# Patient Record
Sex: Male | Born: 1955 | Race: White | Hispanic: No | Marital: Married | Smoking: Never smoker
Health system: Southern US, Community
[De-identification: ages and names within clinical notes are randomized; demographics above are authoritative.]

## PROBLEM LIST (undated history)

## (undated) DIAGNOSIS — Z9989 Dependence on other enabling machines and devices: Secondary | ICD-10-CM

## (undated) DIAGNOSIS — G4733 Obstructive sleep apnea (adult) (pediatric): Secondary | ICD-10-CM

## (undated) DIAGNOSIS — G8929 Other chronic pain: Secondary | ICD-10-CM

## (undated) DIAGNOSIS — E119 Type 2 diabetes mellitus without complications: Secondary | ICD-10-CM

## (undated) DIAGNOSIS — M1990 Unspecified osteoarthritis, unspecified site: Secondary | ICD-10-CM

## (undated) DIAGNOSIS — M549 Dorsalgia, unspecified: Secondary | ICD-10-CM

## (undated) DIAGNOSIS — T8859XA Other complications of anesthesia, initial encounter: Secondary | ICD-10-CM

## (undated) DIAGNOSIS — R112 Nausea with vomiting, unspecified: Secondary | ICD-10-CM

## (undated) DIAGNOSIS — F419 Anxiety disorder, unspecified: Secondary | ICD-10-CM

## (undated) DIAGNOSIS — Z9889 Other specified postprocedural states: Secondary | ICD-10-CM

## (undated) DIAGNOSIS — E039 Hypothyroidism, unspecified: Secondary | ICD-10-CM

## (undated) DIAGNOSIS — I10 Essential (primary) hypertension: Secondary | ICD-10-CM

## (undated) DIAGNOSIS — T4145XA Adverse effect of unspecified anesthetic, initial encounter: Secondary | ICD-10-CM

## (undated) DIAGNOSIS — Z9289 Personal history of other medical treatment: Secondary | ICD-10-CM

## (undated) HISTORY — PX: BACK SURGERY: SHX140

## (undated) HISTORY — PX: HERNIA REPAIR: SHX51

## (undated) HISTORY — PX: KNEE ARTHROSCOPY: SUR90

## (undated) HISTORY — PX: POSTERIOR LUMBAR FUSION: SHX6036

## (undated) HISTORY — PX: CHOLECYSTECTOMY: SHX55

## (undated) HISTORY — PX: KNEE ARTHROSCOPY: SHX127

---

## 1979-01-13 HISTORY — PX: VARICOSE VEIN SURGERY: SHX832

## 1979-05-15 HISTORY — PX: APPENDECTOMY: SHX54

## 2000-03-21 ENCOUNTER — Encounter: Payer: Self-pay | Admitting: Unknown Physician Specialty

## 2000-03-21 ENCOUNTER — Ambulatory Visit (HOSPITAL_COMMUNITY): Admission: RE | Admit: 2000-03-21 | Discharge: 2000-03-21 | Payer: Self-pay | Admitting: Unknown Physician Specialty

## 2000-06-29 ENCOUNTER — Encounter: Payer: Self-pay | Admitting: Neurosurgery

## 2000-06-29 ENCOUNTER — Ambulatory Visit (HOSPITAL_COMMUNITY): Admission: RE | Admit: 2000-06-29 | Discharge: 2000-06-29 | Payer: Self-pay | Admitting: Neurosurgery

## 2003-05-15 HISTORY — PX: EXCISIONAL HEMORRHOIDECTOMY: SHX1541

## 2006-05-14 HISTORY — PX: ANTERIOR CERVICAL DECOMP/DISCECTOMY FUSION: SHX1161

## 2007-02-25 ENCOUNTER — Ambulatory Visit (HOSPITAL_COMMUNITY): Admission: RE | Admit: 2007-02-25 | Discharge: 2007-02-26 | Payer: Self-pay | Admitting: Neurosurgery

## 2007-05-15 HISTORY — PX: ANTERIOR CERVICAL DECOMP/DISCECTOMY FUSION: SHX1161

## 2008-05-14 HISTORY — PX: CHOLECYSTECTOMY: SHX55

## 2008-05-14 HISTORY — PX: BICEPS TENDON REPAIR: SHX566

## 2009-05-14 HISTORY — PX: COLONOSCOPY: SHX174

## 2010-05-14 HISTORY — PX: HERNIA REPAIR: SHX51

## 2010-09-26 NOTE — Op Note (Signed)
NAME:  Joshua Nielsen, Joshua Nielsen NO.:  [id number]   MEDICAL RECORD NO.:  [id number]          PATIENT TYPE:  OIB   LOCATION:  3172                         FACILITY:  [facility]   PHYSICIAN:  Payton Doughty, M.D.      DATE OF BIRTH:  21-Feb-1956   DATE OF PROCEDURE:  02/25/2007  DATE OF DISCHARGE:                               OPERATIVE REPORT   PREOPERATIVE DIAGNOSIS:  Spondylosis C6-7.   POSTOPERATIVE DIAGNOSIS:  Spondylosis C6-7.   OPERATIVE PROCEDURE:  C6-7 anterior cervical decompression fusion with  Reflex hybrid plate.   ANESTHESIA:  General endotracheal.   PREP:  Sterile Betadine prep and scrub with alcohol wipe.   COMPLICATIONS:  None.   ASSISTANT:  Clydene Fake, M.D.   BODY OF TEXT:  A 55 year old gentleman with cervical spondylosis and a  right C7 radiculopathy, taken to operating room smoothly anesthetized  intubated, placed supine on the operating table in halter head traction.  Following shave, prep, draped in the usual sterile fashion, skin was  incised from midline to medial border sternocleidomastoid muscle on the  left side.  The platysma was identified, elevated, divided and  undermined.  Sternocleidomastoid was identified and medial dissection  revealed the carotid artery retracted laterally to the left, trachea and  esophagus retracted laterally to right exposing the bones the anterior  cervical spine.  Marker was placed.  Intraoperative x-ray obtained,  confirmed correctness of level.  Having confirmed correctness of level,  diskectomy was carried out under gross observation.  The operating  microscope was then brought in.  We used microdissection technique to  remove the remaining disks, dissect the neural foramina bilaterally and  divide the posterior longitudinal ligament.  There was significant  spondylosis and spurring found much worse on the right than on the left.  Following complete decompression an 8 mm bone graft fashioned from  patellar  allograft and tapped into place.  The 16 mm Reflex hybrid plate  was then placed using 12 mm screws, two in C6 and two in C7.  Intraoperative x-ray could show only the top of the construct.  Wound  was irrigated.  Hemostasis assured and successive layers of 3-0 Vicryl,  4-0 Vicryl used to close.  Benzoin, Steri-Strips were placed, made with  occlusive Telfa and OpSite.  The patient placed in Aspen collar and  returned recovery room in good condition.           ______________________________  Payton Doughty, M.D.     MWR/MEDQ  D:  02/25/2007  T:  02/26/2007  Job:  [id number]

## 2010-09-26 NOTE — H&P (Signed)
NAME:  Joshua, Nielsen NO.:  [id number]   MEDICAL RECORD NO.:  [id number]          PATIENT TYPE:  OIB   LOCATION:  5157                         FACILITY:  [facility]   PHYSICIAN:  Payton Doughty, M.D.      DATE OF BIRTH:  1956/03/16   DATE OF ADMISSION:  02/25/2007  DATE OF DISCHARGE:                              HISTORY & PHYSICAL   ADMITTING DIAGNOSIS:  Right C7 radiculopathy secondary to disk and spur.   HISTORY OF PRESENT ILLNESS:  Joshua Nielsen is a self-referred 55 year old  right-handed white gentleman, Dr. Roxan Hockey operated on 15 years for disk  in his lumbar spine, having pain in his neck and his arm around his  right shoulder.  He has numbness in the dorsum of the right hand,  particularly at the middle finger.  The left hand does not appear  affected.  He can bring his arm by extension and rightward rotation of  his head.   MEDICAL HISTORY:  Benign.  He has hypertension for which he is on Diovan  and Lotrel.  He takes Synthroid and Xanax.   ALLERGIES:  He is allergic to DEMEROL AND LEVAQUIN.   SURGICAL HISTORY:  He has had an appendectomy, vein stripping,  arthroplasty, diskectomy, hemorrhages and a left knee.   SOCIAL HISTORY:  He does not smoke and does not drink.  He is a  Public affairs consultant.   FAMILY HISTORY:  His mom is 54.  She has had a TIAs, mitral valve  prolapse, hypertension and anemia.  His daddy is deceased with coronary  artery disease in 84.   REVIEW OF SYSTEMS:  Remarkable for glasses, hypertension, back pain and  neck pain.   PHYSICAL EXAMINATION:  HEENT:  Normal.  He has reasonable range of  motion of his neck, but he does set his arm off by turning his head to  the right.  CHEST:  Clear.  CARDIAC:  Regular rate and rhythm.  ABDOMEN:  Large but nontender with hepatosplenomegaly.  EXTREMITIES:  Without clubbing or cyanosis.  Peripheral pulses are good.  GU:  Deferred.  NEUROLOGICAL:  He is awake, alert and oriented.  His  cranial nerves are  intact.  Motor exam shows 5/5 strength in the upper extremities.  Sensory deficit described in the right C7 distribution.  Reflexes are 1  at the left biceps, 1 at the right biceps, 1 at the left triceps, absent  at the right triceps, 1 at the brachial radialis bilaterally.  Hoffman's  is negative.   STUDIES:  MR demonstrates disk and spur at C6-7 with compression in the  right C7 root.   PLAN:  Anterior decompression and fusion at C6-7.  The risks and  benefits have been discussed with them and they wish to proceed.           ______________________________  Payton Doughty, M.D.     MWR/MEDQ  D:  02/25/2007  T:  02/25/2007  Job:  [id number]

## 2011-02-22 LAB — COMPREHENSIVE METABOLIC PANEL
ALT: 51
AST: 48 — ABNORMAL HIGH
Alkaline Phosphatase: 64
CO2: 31
Calcium: 9.1
Chloride: 101
GFR calc Af Amer: >60
GFR calc non Af Amer: >60
Glucose, Bld: 95
Potassium: 3.8
Sodium: 137
Total Bilirubin: 2.1 — ABNORMAL HIGH

## 2011-02-22 LAB — URINALYSIS, ROUTINE W REFLEX MICROSCOPIC
Bilirubin Urine: NEGATIVE
Glucose, UA: NEGATIVE
Hgb urine dipstick: NEGATIVE
Protein, ur: NEGATIVE

## 2011-02-22 LAB — DIFFERENTIAL
Basophils Relative: 1
Eosinophils Absolute: 0.1
Eosinophils Relative: 2
Lymphs Abs: 1.9
Neutrophils Relative %: 61

## 2011-02-22 LAB — CBC
Hemoglobin: 15.5
RBC: 5.03
WBC: 7.2

## 2011-02-22 LAB — TYPE AND SCREEN
ABO/RH(D): A POS
Antibody Screen: NEGATIVE

## 2011-02-22 LAB — PROTIME-INR: Prothrombin Time: 14

## 2012-03-07 ENCOUNTER — Other Ambulatory Visit: Payer: Self-pay | Admitting: Neurosurgery

## 2012-03-07 DIAGNOSIS — M47812 Spondylosis without myelopathy or radiculopathy, cervical region: Secondary | ICD-10-CM

## 2012-03-14 ENCOUNTER — Ambulatory Visit
Admission: RE | Admit: 2012-03-14 | Discharge: 2012-03-14 | Disposition: A | Discharge status: Home / Self Care | Payer: Managed Care, Other (non HMO) | Source: Ambulatory Visit | Attending: Neurosurgery | Admitting: Neurosurgery

## 2012-03-14 VITALS — BP 134/82 | HR 45

## 2012-03-14 DIAGNOSIS — M47812 Spondylosis without myelopathy or radiculopathy, cervical region: Secondary | ICD-10-CM

## 2012-03-14 MED ORDER — IOHEXOL 300 MG/ML  SOLN
1.0000 mL | Freq: Once | INTRAMUSCULAR | Status: AC | PRN
  Administered 2012-03-14: 1 mL via EPIDURAL

## 2012-03-14 MED ORDER — TRIAMCINOLONE ACETONIDE 40 MG/ML IJ SUSP (RADIOLOGY)
60.0000 mg | Freq: Once | INTRAMUSCULAR | Status: AC
  Administered 2012-03-14: 60 mg via EPIDURAL

## 2012-03-14 NOTE — Discharge Instructions (Signed)

## 2012-04-18 ENCOUNTER — Other Ambulatory Visit: Payer: Self-pay | Admitting: Neurosurgery

## 2012-04-18 DIAGNOSIS — M4722 Other spondylosis with radiculopathy, cervical region: Secondary | ICD-10-CM

## 2012-04-23 ENCOUNTER — Ambulatory Visit
Admission: RE | Admit: 2012-04-23 | Discharge: 2012-04-23 | Disposition: A | Discharge status: Home / Self Care | Payer: Managed Care, Other (non HMO) | Source: Ambulatory Visit | Attending: Neurosurgery | Admitting: Neurosurgery

## 2012-04-23 VITALS — BP 153/88 | HR 56 | Ht 70.0 in | Wt 240.0 lb

## 2012-04-23 DIAGNOSIS — M4722 Other spondylosis with radiculopathy, cervical region: Secondary | ICD-10-CM

## 2012-04-23 MED ORDER — IOHEXOL 300 MG/ML  SOLN
1.0000 mL | Freq: Once | INTRAMUSCULAR | Status: AC | PRN
  Administered 2012-04-23: 1 mL via EPIDURAL

## 2012-04-23 MED ORDER — TRIAMCINOLONE ACETONIDE 40 MG/ML IJ SUSP (RADIOLOGY)
60.0000 mg | Freq: Once | INTRAMUSCULAR | Status: AC
  Administered 2012-04-23: 60 mg via EPIDURAL

## 2012-05-14 DIAGNOSIS — E119 Type 2 diabetes mellitus without complications: Secondary | ICD-10-CM

## 2012-05-14 HISTORY — DX: Type 2 diabetes mellitus without complications: E11.9

## 2012-08-13 ENCOUNTER — Other Ambulatory Visit: Payer: Self-pay | Admitting: Neurosurgery

## 2012-08-13 DIAGNOSIS — M47816 Spondylosis without myelopathy or radiculopathy, lumbar region: Secondary | ICD-10-CM

## 2012-08-19 ENCOUNTER — Ambulatory Visit
Admission: RE | Admit: 2012-08-19 | Discharge: 2012-08-19 | Disposition: A | Discharge status: Home / Self Care | Payer: Managed Care, Other (non HMO) | Source: Ambulatory Visit | Attending: Neurosurgery | Admitting: Neurosurgery

## 2012-08-19 DIAGNOSIS — M47816 Spondylosis without myelopathy or radiculopathy, lumbar region: Secondary | ICD-10-CM

## 2012-08-26 ENCOUNTER — Other Ambulatory Visit: Payer: Self-pay | Admitting: Neurosurgery

## 2012-09-04 NOTE — Pre-Procedure Instructions (Signed)
TISHAWN FRIEDHOFF  09/04/2012   Your procedure is scheduled on:  Friday Sep 19, 2012.  Report to [facility] Elevators 3rd floor at 5:30 AM.  Call this number if you have problems the morning of surgery: [phone]   Remember:   Do not eat food or drink liquids after midnight.   Take these medicines the morning of surgery with A SIP OF WATER: Lotrel, Synthroid, and Xanax if needed Do not take any diabetic medications the day of surgery  Discontinue aspirin, Coumadin, Plavix, Effient, and herbal medications 7 days prior to surgery   Do not wear jewelry  Do not wear lotions or colognes.  . Men may shave face and neck.  Do not bring valuables to the hospital.  Contacts, dentures or bridgework may not be worn into surgery.  Leave suitcase in the car. After surgery it may be brought to your room.  For patients admitted to the hospital, checkout time is 11:00 AM the day of discharge.   Patients discharged the day of surgery will not be allowed to drive home.  Name and phone number of your driver: Family/Friend  Special Instructions: Shower using CHG 2 nights before surgery and the night before surgery.  If you shower the day of surgery use CHG.  Use special wash - you have one bottle of CHG for all showers.  You should use approximately 1/3 of the bottle for each shower.   Please read over the following fact sheets that you were given: Pain Booklet, Coughing and Deep Breathing, Blood Transfusion Information, MRSA Information and Surgical Site Infection Prevention

## 2012-09-05 ENCOUNTER — Encounter (HOSPITAL_COMMUNITY): Payer: Self-pay

## 2012-09-05 ENCOUNTER — Encounter (HOSPITAL_COMMUNITY)
Admission: RE | Admit: 2012-09-05 | Discharge: 2012-09-05 | Disposition: A | Discharge status: Home / Self Care | Payer: Managed Care, Other (non HMO) | Source: Ambulatory Visit | Attending: Neurosurgery | Admitting: Neurosurgery

## 2012-09-05 HISTORY — DX: Other specified postprocedural states: Z98.890

## 2012-09-05 HISTORY — DX: Nausea with vomiting, unspecified: R11.2

## 2012-09-05 HISTORY — DX: Hypothyroidism, unspecified: E03.9

## 2012-09-05 HISTORY — DX: Essential (primary) hypertension: I10

## 2012-09-05 HISTORY — DX: Unspecified osteoarthritis, unspecified site: M19.90

## 2012-09-05 LAB — CBC
Hemoglobin: 15 g/dL (ref 13.0–17.0)
MCHC: 33.9 g/dL (ref 30.0–36.0)
RDW: 13.3 % (ref 11.5–15.5)

## 2012-09-05 LAB — BASIC METABOLIC PANEL
BUN: 14 mg/dL (ref 6–23)
GFR calc Af Amer: >90 mL/min (ref >90)
GFR calc non Af Amer: >90 mL/min (ref >90)
Potassium: 3.7 mEq/L (ref 3.5–5.1)
Sodium: 142 mEq/L (ref 135–145)

## 2012-09-05 LAB — SURGICAL PCR SCREEN
MRSA, PCR: NEGATIVE
Staphylococcus aureus: NEGATIVE

## 2012-09-05 NOTE — Progress Notes (Signed)
Patient informed Nurse that he had a stress test in 2007 at [facility] in [place], [place]. Patient denied having a cardiac cath. Patient wears CPAP nightly and was instructed to bring mask DOS. Patient verbalized understanding. Type and screen will be done DOS per patients request. Orders entered STAT into EPIC for DOS.

## 2012-09-08 ENCOUNTER — Encounter (HOSPITAL_COMMUNITY): Payer: Self-pay | Admitting: Pharmacist

## 2012-09-18 MED ORDER — VANCOMYCIN HCL IN DEXTROSE 1-5 GM/200ML-% IV SOLN
1000.0000 mg | INTRAVENOUS | Status: AC
  Administered 2012-09-19: 1000 mg via INTRAVENOUS

## 2012-09-18 NOTE — H&P (Signed)
[facility] [address] [place], [place]  [address] [phone]  Fax [phone]  OUTPATIENT OFFICE NOTE  August 25, 2012  Patient Name:  Joshua Nielsen   #[id number] Date of Birth:  Jun 07, 1955    HOPI:      I had the pleasure of seeing [name] back today after his MRI was obtained on an urgent basis.  He has severe spinal stenosis and lateral recess stenosis, right greater than left, at the L4-5 level, the site of previous decompressive laminectomy for spinal stenosis.  He is complaining of significant right leg pain.  He is barely able to bear weight.  He leans toward the left.  He says he is miserable.  IMPRESSION/PLAN:  I do not believe that nonsurgical options will likely give him much relief, and I do think that he needs to undergo redo decompression as well as a fusion at the L4-5 level.  This can likely be done as a TLIF approach, but I think it will necessary to disarticulate his facet joint in order to gain access to the severe spondylotic and broad foraminal stenosis and compressive problem.  Based on this, I recommended redo decompression and fusion at the L4-5 level.  He wishes to proceed, and this will be scheduled on an expedited basis.  We went over details of the surgery, attendant risks and benefits, and went over models of specific surgery we recommended as well as fitting him for a lumbosacral orthosis.  He wishes to proceed.  He is now scheduled for surgery on 09/19/2012.     Danae Orleans. 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            HISTORY OF PRESENT ILLNESS:                       He currently describes his low-back as 1-2 out of 10 but right  buttock pain as 6-8 out of 10 in severity. He also has neck pain, which he grades at 4-5 out of 10, and right arm pain, which he grades at 0-1 out of 10. He notes numbness in his right thigh, which he describes that it feels as he has hot water running down it.  He also notes numbness into his right foot and right leg.  He feels that he is weak in his right leg.  He says this has been going on since December 2013.  He is scheduled for an MRI of his lumbar spine tomorrow.  He saw Dr. Channing Mutters in 01/2012 for neck pain and also notes a history of leg numbness and burning.  Dr. Channing Mutters referred him here due to distance as  he did not want to go to [facility] for further care.    He has been taking ibuprofen or Aleve on an as-needed basis.  He has previously had lumbar decompressive surgery by Dr. Roxan Hockey in 1992.  Additionally, he has had cervical epidural steroid injections in 03/2012 and 04/2012, which  he says helped for about 5 weeks.  Additional medical problems include hypertension, hypothyroidism, non-insulin-dependent diabetes, anxiety, and sleep apnea. He is 5 feet, 10 inches tall, 237 pounds.  He denies tobacco, alcohol, or drug use.   Joshua Nielsen gave a detailed description of his previous back problems.  In 1992, he had an injury while putting vinyl siding on a church, and then he had an MRI, which showed a herniated disk at L4-5 but with bed-rest and medicine it got better.  He then had another episode in 1993.  He had another MRI, again treated with bed-rest and medicine.  In 1994, he had another severe episode of right leg pain, and an MRI showed that he had re-ruptured the disk, and then he did surgery with a laminectomy for that.  He has been seeing Dr. Channing Mutters for his neck problems.    CURRENT MEDICATIONS:                       Include Diovan/HCT 320/25 mg daily, Lotrel 5/10 mg daily, Synthroid 0.025 mg daily, Xanax 0.5 mg as needed, Metformin HCl 500 mg 2 times daily, Phentermine 37.5 mg daily.  HE NOTES  ALLERGIES TO DEMEROL AND LEVAQUIN.   PAST SURGICAL HISTORY:                     He had an appendectomy in 1981 by Dr. Meryl Crutch, veins stripped in left leg, arthroscopy right knee in 1988 and 1989 by Dr. Cathrine Muster, back surgery at L4-5 by Dr. Roxan Hockey in 1994, hemorrhoidectomy by Dr. Logan Bores in 2005, arthroscopic left knee surgery in 2007 by Dr. Earma Reading, anterior decompression and fusion at C6-7 by Dr. Channing Mutters in October 2008, arthroscopic right knee surgery in 02/2008 by Dr. Earma Reading, repair of rupture biceps tendon right arm 01/2009 by Dr. Earma Reading, cholecystectomy in December 2010 by Dr. Irving Burton, incisional hernia repair in 05/2010 by Dr. Irving Burton.          PAST MEDICAL HISTORY:                       He had a stress test in 2007 before his knee surgery, colonoscopy in 2011, tetanus vaccination in 2008.  He started CPAP for sleep apnea in 2013.  He had an MRI in October 2013, which demonstrated a herniated cervical disk and resulted in epidural steroid injections in his neck.  He had a complete physical on 06/06/2012 and started metformin at that time.  Additional medical problems include injuries to his right finger, severely strained ankle, and measles and chicken pox as childhood illness.    REVIEW OF SYSTEMS:                              Detailed review of systems sheet was reviewed with the patient.  Under eyes, he wears glasses.  Under cardiovascular, he notes high blood pressure.  Under musculoskeletal, he notes leg weakness and back pain and neck pain.  Under endocrine, he notes diabetes and thyroid disease.  Otherwise unremarkable.   FAMILY HISTORY:                                     Mother is deceased at age 80.  Father died at age 64.  Medical problems noncontributory.  PHYSICAL EXAMINATION:                     [name] complains of pain radiating in his buttock, which he describes as "steady, like a toothache."  He notes right leg burning to his knee and outer foot.  His blood  pressure is 114/78, heart rate is 70 and regular, respiratory rate is 16.  He is obese.            HEENT -  Normal.            Respiratory -  clear to auscultation.            Cardiovascular -  regular rate and rhythm without murmur.            Abdomen -   obese, with active bowel sounds, no hepatosplenomegaly appreciated.            Musculoskeletal Examination -  has a healed midline lumbar incision, he has right sciatic notch discomfort to palpation.  He is able to bend within 8 inches of the floor with his upper extremities outstretched.  He is able to stand on his heels and toes, but walks with an antalgic gait favoring his right lower extremity.  He has positive straight leg raise at 30 degrees on the right.   NEUROLOGICAL EXAMINATION:        He is awake, alert, and conversant.  He speaks with clear and fluent speech.            Cranial Nerve Examination - normal.            Motor Examination - full in all upper and lower extremity motor groups.             Sensory Examination - he complains of numbness in his right second and third digits as well as decreased pin sensation right L5 and S1 distribution.            Cerebellar Examination - normal examination.   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  He is scheduled to have an MRI, and I will evaluate that with him and make further recommendations after that has been done.   [facility]       Danae Orleans. Venetia Maxon, MD

## 2012-09-19 ENCOUNTER — Encounter (HOSPITAL_COMMUNITY): Payer: Self-pay | Admitting: Anesthesiology

## 2012-09-19 ENCOUNTER — Ambulatory Visit (HOSPITAL_COMMUNITY): Payer: Managed Care, Other (non HMO) | Admitting: Anesthesiology

## 2012-09-19 ENCOUNTER — Encounter (HOSPITAL_COMMUNITY)
Admission: RE | Disposition: A | Discharge status: Home / Self Care | Payer: Self-pay | Source: Ambulatory Visit | Attending: Neurosurgery

## 2012-09-19 ENCOUNTER — Ambulatory Visit (HOSPITAL_COMMUNITY): Payer: Managed Care, Other (non HMO)

## 2012-09-19 ENCOUNTER — Inpatient Hospital Stay (HOSPITAL_COMMUNITY)
Admission: RE | Admit: 2012-09-19 | Discharge: 2012-09-22 | DRG: 460 | Disposition: A | Discharge status: Home / Self Care | Payer: Managed Care, Other (non HMO) | Source: Ambulatory Visit | Attending: Neurosurgery | Admitting: Neurosurgery

## 2012-09-19 DIAGNOSIS — F411 Generalized anxiety disorder: Secondary | ICD-10-CM | POA: Diagnosis present

## 2012-09-19 DIAGNOSIS — Z01812 Encounter for preprocedural laboratory examination: Secondary | ICD-10-CM

## 2012-09-19 DIAGNOSIS — Z01818 Encounter for other preprocedural examination: Secondary | ICD-10-CM

## 2012-09-19 DIAGNOSIS — E119 Type 2 diabetes mellitus without complications: Secondary | ICD-10-CM | POA: Diagnosis present

## 2012-09-19 DIAGNOSIS — E039 Hypothyroidism, unspecified: Secondary | ICD-10-CM | POA: Diagnosis present

## 2012-09-19 DIAGNOSIS — M47817 Spondylosis without myelopathy or radiculopathy, lumbosacral region: Secondary | ICD-10-CM | POA: Diagnosis present

## 2012-09-19 DIAGNOSIS — Z79899 Other long term (current) drug therapy: Secondary | ICD-10-CM

## 2012-09-19 DIAGNOSIS — Z01810 Encounter for preprocedural cardiovascular examination: Secondary | ICD-10-CM

## 2012-09-19 DIAGNOSIS — I10 Essential (primary) hypertension: Secondary | ICD-10-CM | POA: Diagnosis present

## 2012-09-19 DIAGNOSIS — E669 Obesity, unspecified: Secondary | ICD-10-CM | POA: Diagnosis present

## 2012-09-19 DIAGNOSIS — Q762 Congenital spondylolisthesis: Secondary | ICD-10-CM

## 2012-09-19 DIAGNOSIS — G4733 Obstructive sleep apnea (adult) (pediatric): Secondary | ICD-10-CM | POA: Diagnosis present

## 2012-09-19 DIAGNOSIS — M5126 Other intervertebral disc displacement, lumbar region: Principal | ICD-10-CM | POA: Diagnosis present

## 2012-09-19 HISTORY — DX: Type 2 diabetes mellitus without complications: E11.9

## 2012-09-19 HISTORY — DX: Obstructive sleep apnea (adult) (pediatric): G47.33

## 2012-09-19 HISTORY — DX: Other chronic pain: G89.29

## 2012-09-19 HISTORY — DX: Anxiety disorder, unspecified: F41.9

## 2012-09-19 HISTORY — DX: Dependence on other enabling machines and devices: Z99.89

## 2012-09-19 HISTORY — DX: Dorsalgia, unspecified: M54.9

## 2012-09-19 HISTORY — PX: POSTERIOR LUMBAR FUSION: SHX6036

## 2012-09-19 LAB — TYPE AND SCREEN
ABO/RH(D): A POS
Antibody Screen: NEGATIVE

## 2012-09-19 LAB — GLUCOSE, CAPILLARY: Glucose-Capillary: 136 mg/dL — ABNORMAL HIGH (ref 70–99)

## 2012-09-19 SURGERY — POSTERIOR LUMBAR FUSION 1 LEVEL
Anesthesia: General | Site: Back | Wound class: Clean

## 2012-09-19 MED ORDER — PANTOPRAZOLE SODIUM 40 MG IV SOLR
40.0000 mg | Freq: Every day | INTRAVENOUS | Status: DC
  Administered 2012-09-19: 40 mg via INTRAVENOUS
  Filled 2012-09-19 (×2): qty 40

## 2012-09-19 MED ORDER — BISACODYL 10 MG RE SUPP
10.0000 mg | Freq: Every day | RECTAL | Status: DC | PRN
  Administered 2012-09-22: 10 mg via RECTAL
  Filled 2012-09-19: qty 1

## 2012-09-19 MED ORDER — POLYETHYLENE GLYCOL 3350 17 G PO PACK
17.0000 g | PACK | Freq: Every day | ORAL | Status: DC | PRN
  Filled 2012-09-19: qty 1

## 2012-09-19 MED ORDER — PHENOL 1.4 % MT LIQD: 1.0000 | OROMUCOSAL | Status: DC | PRN

## 2012-09-19 MED ORDER — ALUM & MAG HYDROXIDE-SIMETH 200-200-20 MG/5ML PO SUSP: 30.0000 mL | Freq: Four times a day (QID) | ORAL | Status: DC | PRN

## 2012-09-19 MED ORDER — MORPHINE SULFATE (PF) 1 MG/ML IV SOLN
INTRAVENOUS | Status: AC
  Administered 2012-09-19: 1 mg via INTRAVENOUS
  Filled 2012-09-19: qty 25

## 2012-09-19 MED ORDER — MIDAZOLAM HCL 5 MG/5ML IJ SOLN
INTRAMUSCULAR | Status: DC | PRN
  Administered 2012-09-19: 2 mg via INTRAVENOUS

## 2012-09-19 MED ORDER — ACETAMINOPHEN 325 MG PO TABS: 650.0000 mg | ORAL_TABLET | ORAL | Status: DC | PRN

## 2012-09-19 MED ORDER — THROMBIN 20000 UNITS EX SOLR
CUTANEOUS | Status: DC | PRN
  Administered 2012-09-19: 08:00:00 via TOPICAL

## 2012-09-19 MED ORDER — MORPHINE SULFATE (PF) 1 MG/ML IV SOLN
INTRAVENOUS | Status: DC
  Administered 2012-09-19: 30 mg via INTRAVENOUS
  Administered 2012-09-19: 5 mg via INTRAVENOUS
  Administered 2012-09-19: 9 mg via INTRAVENOUS
  Administered 2012-09-20: 09:00:00 via INTRAVENOUS
  Administered 2012-09-20: 9 mg via INTRAVENOUS
  Administered 2012-09-20: 1 mg via INTRAVENOUS
  Administered 2012-09-20: 6 mg via INTRAVENOUS
  Administered 2012-09-21: 3.99 mg via INTRAVENOUS
  Filled 2012-09-19 (×2): qty 25

## 2012-09-19 MED ORDER — ONDANSETRON HCL 4 MG/2ML IJ SOLN
4.0000 mg | Freq: Four times a day (QID) | INTRAMUSCULAR | Status: DC | PRN
  Filled 2012-09-19: qty 2

## 2012-09-19 MED ORDER — HYDROMORPHONE HCL PF 1 MG/ML IJ SOLN
INTRAMUSCULAR | Status: AC
  Filled 2012-09-19: qty 1

## 2012-09-19 MED ORDER — DIPHENHYDRAMINE HCL 50 MG/ML IJ SOLN
12.5000 mg | Freq: Four times a day (QID) | INTRAMUSCULAR | Status: DC | PRN
  Filled 2012-09-19: qty 0.25

## 2012-09-19 MED ORDER — DIPHENHYDRAMINE HCL 50 MG/ML IJ SOLN: 12.5000 mg | Freq: Four times a day (QID) | INTRAMUSCULAR | Status: DC | PRN

## 2012-09-19 MED ORDER — SODIUM CHLORIDE 0.9 % IJ SOLN
3.0000 mL | Freq: Two times a day (BID) | INTRAMUSCULAR | Status: DC
  Administered 2012-09-19 – 2012-09-21 (×3): 3 mL via INTRAVENOUS

## 2012-09-19 MED ORDER — MORPHINE SULFATE (PF) 1 MG/ML IV SOLN: INTRAVENOUS | Status: DC

## 2012-09-19 MED ORDER — BENAZEPRIL HCL 20 MG PO TABS
20.0000 mg | ORAL_TABLET | Freq: Every day | ORAL | Status: DC
  Administered 2012-09-21 – 2012-09-22 (×2): 20 mg via ORAL
  Filled 2012-09-19 (×3): qty 1

## 2012-09-19 MED ORDER — PROPOFOL 10 MG/ML IV BOLUS
INTRAVENOUS | Status: DC | PRN
  Administered 2012-09-19: 150 mg via INTRAVENOUS

## 2012-09-19 MED ORDER — GLYCOPYRROLATE 0.2 MG/ML IJ SOLN
INTRAMUSCULAR | Status: DC | PRN
  Administered 2012-09-19: 0.2 mg via INTRAVENOUS

## 2012-09-19 MED ORDER — FENTANYL CITRATE 0.05 MG/ML IJ SOLN
INTRAMUSCULAR | Status: DC | PRN
  Administered 2012-09-19: 50 ug via INTRAVENOUS
  Administered 2012-09-19: 150 ug via INTRAVENOUS
  Administered 2012-09-19: 50 ug via INTRAVENOUS

## 2012-09-19 MED ORDER — KCL IN DEXTROSE-NACL 20-5-0.45 MEQ/L-%-% IV SOLN
INTRAVENOUS | Status: DC
  Administered 2012-09-19: 1000 mL via INTRAVENOUS
  Administered 2012-09-19 – 2012-09-20 (×2): via INTRAVENOUS
  Filled 2012-09-19 (×7): qty 1000

## 2012-09-19 MED ORDER — AMLODIPINE BESYLATE 10 MG PO TABS
10.0000 mg | ORAL_TABLET | Freq: Every day | ORAL | Status: DC
  Administered 2012-09-21 – 2012-09-22 (×2): 10 mg via ORAL
  Filled 2012-09-19 (×3): qty 1

## 2012-09-19 MED ORDER — HYDROCHLOROTHIAZIDE 25 MG PO TABS
25.0000 mg | ORAL_TABLET | Freq: Every day | ORAL | Status: DC
  Administered 2012-09-21 – 2012-09-22 (×2): 25 mg via ORAL
  Filled 2012-09-19 (×3): qty 1

## 2012-09-19 MED ORDER — OXYCODONE HCL 5 MG PO TABS: 5.0000 mg | ORAL_TABLET | Freq: Once | ORAL | Status: DC | PRN

## 2012-09-19 MED ORDER — OXYCODONE HCL 5 MG/5ML PO SOLN: 5.0000 mg | Freq: Once | ORAL | Status: DC | PRN

## 2012-09-19 MED ORDER — AMLODIPINE BESY-BENAZEPRIL HCL 10-20 MG PO CAPS: 1.0000 | ORAL_CAPSULE | Freq: Every morning | ORAL | Status: DC

## 2012-09-19 MED ORDER — EPHEDRINE SULFATE 50 MG/ML IJ SOLN
INTRAMUSCULAR | Status: DC | PRN
  Administered 2012-09-19 (×3): 10 mg via INTRAVENOUS

## 2012-09-19 MED ORDER — DIPHENHYDRAMINE HCL 12.5 MG/5ML PO ELIX
12.5000 mg | ORAL_SOLUTION | Freq: Four times a day (QID) | ORAL | Status: DC | PRN
  Filled 2012-09-19: qty 5

## 2012-09-19 MED ORDER — ZOLPIDEM TARTRATE 5 MG PO TABS: 5.0000 mg | ORAL_TABLET | Freq: Every evening | ORAL | Status: DC | PRN

## 2012-09-19 MED ORDER — ONDANSETRON HCL 4 MG/2ML IJ SOLN: 4.0000 mg | INTRAMUSCULAR | Status: DC | PRN

## 2012-09-19 MED ORDER — MENTHOL 3 MG MT LOZG: 1.0000 | LOZENGE | OROMUCOSAL | Status: DC | PRN

## 2012-09-19 MED ORDER — LACTATED RINGERS IV SOLN
INTRAVENOUS | Status: DC | PRN
  Administered 2012-09-19 (×2): via INTRAVENOUS

## 2012-09-19 MED ORDER — ACETAMINOPHEN 650 MG RE SUPP: 650.0000 mg | RECTAL | Status: DC | PRN

## 2012-09-19 MED ORDER — SODIUM CHLORIDE 0.9 % IV SOLN: 250.0000 mL | INTRAVENOUS | Status: DC

## 2012-09-19 MED ORDER — DIAZEPAM 5 MG PO TABS
5.0000 mg | ORAL_TABLET | Freq: Four times a day (QID) | ORAL | Status: DC | PRN
  Administered 2012-09-21 – 2012-09-22 (×2): 5 mg via ORAL
  Filled 2012-09-19 (×2): qty 1

## 2012-09-19 MED ORDER — DOCUSATE SODIUM 100 MG PO CAPS
100.0000 mg | ORAL_CAPSULE | Freq: Two times a day (BID) | ORAL | Status: DC
  Administered 2012-09-20 – 2012-09-22 (×4): 100 mg via ORAL
  Filled 2012-09-19 (×4): qty 1

## 2012-09-19 MED ORDER — LIDOCAINE-EPINEPHRINE 1 %-1:100000 IJ SOLN
INTRAMUSCULAR | Status: DC | PRN
  Administered 2012-09-19: 5 mL

## 2012-09-19 MED ORDER — PHENYLEPHRINE HCL 10 MG/ML IJ SOLN
INTRAMUSCULAR | Status: DC | PRN
  Administered 2012-09-19 (×2): 80 ug via INTRAVENOUS
  Administered 2012-09-19: 40 ug via INTRAVENOUS
  Administered 2012-09-19: 80 ug via INTRAVENOUS
  Administered 2012-09-19: 40 ug via INTRAVENOUS

## 2012-09-19 MED ORDER — LEVOTHYROXINE SODIUM 25 MCG PO TABS
25.0000 ug | ORAL_TABLET | Freq: Every day | ORAL | Status: DC
  Administered 2012-09-20 – 2012-09-22 (×3): 25 ug via ORAL
  Filled 2012-09-19 (×4): qty 1

## 2012-09-19 MED ORDER — NALOXONE HCL 0.4 MG/ML IJ SOLN: 0.4000 mg | INTRAMUSCULAR | Status: DC | PRN

## 2012-09-19 MED ORDER — HYDROMORPHONE HCL PF 1 MG/ML IJ SOLN
0.2500 mg | INTRAMUSCULAR | Status: DC | PRN
  Administered 2012-09-19 (×4): 0.5 mg via INTRAVENOUS

## 2012-09-19 MED ORDER — LIDOCAINE HCL (CARDIAC) 20 MG/ML IV SOLN
INTRAVENOUS | Status: DC | PRN
  Administered 2012-09-19: 50 mg via INTRAVENOUS

## 2012-09-19 MED ORDER — DIPHENHYDRAMINE HCL 12.5 MG/5ML PO ELIX: 12.5000 mg | ORAL_SOLUTION | Freq: Four times a day (QID) | ORAL | Status: DC | PRN

## 2012-09-19 MED ORDER — CEFAZOLIN SODIUM 1-5 GM-% IV SOLN
1.0000 g | Freq: Three times a day (TID) | INTRAVENOUS | Status: AC
  Administered 2012-09-19 – 2012-09-20 (×2): 1 g via INTRAVENOUS
  Filled 2012-09-19 (×2): qty 50

## 2012-09-19 MED ORDER — ACETAMINOPHEN 10 MG/ML IV SOLN
INTRAVENOUS | Status: AC
  Administered 2012-09-19: 1000 mg via INTRAVENOUS
  Filled 2012-09-19: qty 100

## 2012-09-19 MED ORDER — SODIUM CHLORIDE 0.9 % IJ SOLN: 9.0000 mL | INTRAMUSCULAR | Status: DC | PRN

## 2012-09-19 MED ORDER — DEXAMETHASONE SODIUM PHOSPHATE 10 MG/ML IJ SOLN
INTRAMUSCULAR | Status: DC | PRN
  Administered 2012-09-19: 8 mg via INTRAVENOUS

## 2012-09-19 MED ORDER — VALSARTAN-HYDROCHLOROTHIAZIDE 320-25 MG PO TABS: 1.0000 | ORAL_TABLET | Freq: Every morning | ORAL | Status: DC

## 2012-09-19 MED ORDER — HYDROCODONE-ACETAMINOPHEN 5-325 MG PO TABS
1.0000 | ORAL_TABLET | ORAL | Status: DC | PRN
  Administered 2012-09-22: 2 via ORAL
  Filled 2012-09-19: qty 2

## 2012-09-19 MED ORDER — OXYCODONE-ACETAMINOPHEN 5-325 MG PO TABS
1.0000 | ORAL_TABLET | ORAL | Status: DC | PRN
  Administered 2012-09-21 – 2012-09-22 (×4): 2 via ORAL
  Administered 2012-09-22: 1 via ORAL
  Filled 2012-09-19 (×5): qty 2

## 2012-09-19 MED ORDER — ROCURONIUM BROMIDE 100 MG/10ML IV SOLN
INTRAVENOUS | Status: DC | PRN
  Administered 2012-09-19: 50 mg via INTRAVENOUS

## 2012-09-19 MED ORDER — METFORMIN HCL 500 MG PO TABS
500.0000 mg | ORAL_TABLET | Freq: Two times a day (BID) | ORAL | Status: DC
  Administered 2012-09-19 – 2012-09-22 (×6): 500 mg via ORAL
  Filled 2012-09-19 (×9): qty 1

## 2012-09-19 MED ORDER — VANCOMYCIN HCL IN DEXTROSE 1-5 GM/200ML-% IV SOLN
INTRAVENOUS | Status: AC
  Filled 2012-09-19: qty 200

## 2012-09-19 MED ORDER — VECURONIUM BROMIDE 10 MG IV SOLR
INTRAVENOUS | Status: DC | PRN
  Administered 2012-09-19: 3 mg via INTRAVENOUS

## 2012-09-19 MED ORDER — FLEET ENEMA 7-19 GM/118ML RE ENEM: 1.0000 | ENEMA | Freq: Once | RECTAL | Status: AC | PRN

## 2012-09-19 MED ORDER — BUPIVACAINE HCL (PF) 0.5 % IJ SOLN
INTRAMUSCULAR | Status: DC | PRN
  Administered 2012-09-19: 5 mL

## 2012-09-19 MED ORDER — NALOXONE HCL 0.4 MG/ML IJ SOLN
0.4000 mg | INTRAMUSCULAR | Status: DC | PRN
  Filled 2012-09-19: qty 1

## 2012-09-19 MED ORDER — ONDANSETRON HCL 4 MG/2ML IJ SOLN: 4.0000 mg | Freq: Four times a day (QID) | INTRAMUSCULAR | Status: DC | PRN

## 2012-09-19 MED ORDER — IRBESARTAN 300 MG PO TABS
300.0000 mg | ORAL_TABLET | Freq: Every day | ORAL | Status: DC
  Administered 2012-09-21 – 2012-09-22 (×2): 300 mg via ORAL
  Filled 2012-09-19 (×3): qty 1

## 2012-09-19 MED ORDER — SENNA 8.6 MG PO TABS
1.0000 | ORAL_TABLET | Freq: Two times a day (BID) | ORAL | Status: DC
  Administered 2012-09-19 – 2012-09-22 (×6): 8.6 mg via ORAL
  Filled 2012-09-19 (×7): qty 1

## 2012-09-19 MED ORDER — SODIUM CHLORIDE 0.9 % IJ SOLN: 3.0000 mL | INTRAMUSCULAR | Status: DC | PRN

## 2012-09-19 MED ORDER — ARTIFICIAL TEARS OP OINT
TOPICAL_OINTMENT | OPHTHALMIC | Status: DC | PRN
  Administered 2012-09-19: 1 via OPHTHALMIC

## 2012-09-19 SURGICAL SUPPLY — 77 items
BAG DECANTER FOR FLEXI CONT (MISCELLANEOUS) ×2 IMPLANT
BENZOIN TINCTURE PRP APPL 2/3 (GAUZE/BANDAGES/DRESSINGS) ×2 IMPLANT
BLADE SURG ROTATE 9660 (MISCELLANEOUS) IMPLANT
BONE VOID FILLER STRIP 10CC (Bone Implant) ×2 IMPLANT
BUR MATCHSTICK NEURO 3.0 LAGG (BURR) ×2 IMPLANT
BUR PRECISION FLUTE 5.0 (BURR) ×2 IMPLANT
CAGE 11MM (Cage) ×4 IMPLANT
CANISTER SUCTION 2500CC (MISCELLANEOUS) ×2 IMPLANT
CLOTH BEACON ORANGE TIMEOUT ST (SAFETY) ×2 IMPLANT
CONT SPEC 4OZ CLIKSEAL STRL BL (MISCELLANEOUS) ×4 IMPLANT
COVER BACK TABLE 24X17X13 BIG (DRAPES) IMPLANT
COVER TABLE BACK 60X90 (DRAPES) ×2 IMPLANT
DERMABOND ADVANCED (GAUZE/BANDAGES/DRESSINGS) ×1
DERMABOND ADVANCED .7 DNX12 (GAUZE/BANDAGES/DRESSINGS) ×1 IMPLANT
DRAPE C-ARM 42X72 X-RAY (DRAPES) ×4 IMPLANT
DRAPE LAPAROTOMY 100X72X124 (DRAPES) ×2 IMPLANT
DRAPE POUCH INSTRU U-SHP 10X18 (DRAPES) ×2 IMPLANT
DRAPE PROXIMA HALF (DRAPES) ×4 IMPLANT
DRAPE SURG 17X23 STRL (DRAPES) ×2 IMPLANT
DRESSING TELFA 8X3 (GAUZE/BANDAGES/DRESSINGS) ×2 IMPLANT
DURAPREP 26ML APPLICATOR (WOUND CARE) ×2 IMPLANT
ELECT BLADE 4.0 EZ CLEAN MEGAD (MISCELLANEOUS) ×2
ELECT REM PT RETURN 9FT ADLT (ELECTROSURGICAL) ×2
ELECTRODE BLDE 4.0 EZ CLN MEGD (MISCELLANEOUS) ×1 IMPLANT
ELECTRODE REM PT RTRN 9FT ADLT (ELECTROSURGICAL) ×1 IMPLANT
EVACUATOR 1/8 PVC DRAIN (DRAIN) ×2 IMPLANT
GAUZE SPONGE 4X4 16PLY XRAY LF (GAUZE/BANDAGES/DRESSINGS) IMPLANT
GLOVE BIO SURGEON STRL SZ8 (GLOVE) ×4 IMPLANT
GLOVE BIOGEL PI IND STRL 8 (GLOVE) ×4 IMPLANT
GLOVE BIOGEL PI IND STRL 8.5 (GLOVE) ×2 IMPLANT
GLOVE BIOGEL PI INDICATOR 8 (GLOVE) ×4
GLOVE BIOGEL PI INDICATOR 8.5 (GLOVE) ×2
GLOVE ECLIPSE 7.5 STRL STRAW (GLOVE) ×2 IMPLANT
GLOVE ECLIPSE 8.0 STRL XLNG CF (GLOVE) ×4 IMPLANT
GLOVE EXAM NITRILE LRG STRL (GLOVE) ×2 IMPLANT
GLOVE EXAM NITRILE MD LF STRL (GLOVE) IMPLANT
GLOVE EXAM NITRILE XL STR (GLOVE) IMPLANT
GLOVE EXAM NITRILE XS STR PU (GLOVE) IMPLANT
GLOVE SURG SS PI 8.5 STRL IVOR (GLOVE) ×4
GLOVE SURG SS PI 8.5 STRL STRW (GLOVE) ×4 IMPLANT
GOWN BRE IMP SLV AUR LG STRL (GOWN DISPOSABLE) ×2 IMPLANT
GOWN BRE IMP SLV AUR XL STRL (GOWN DISPOSABLE) ×10 IMPLANT
GOWN STRL REIN 2XL LVL4 (GOWN DISPOSABLE) ×10 IMPLANT
KIT BASIN OR (CUSTOM PROCEDURE TRAY) ×2 IMPLANT
KIT INFUSE SMALL (Orthopedic Implant) ×2 IMPLANT
KIT POSITION SURG JACKSON T1 (MISCELLANEOUS) ×2 IMPLANT
KIT ROOM TURNOVER OR (KITS) ×2 IMPLANT
MILL MEDIUM DISP (BLADE) ×4 IMPLANT
NEEDLE HYPO 25X1 1.5 SAFETY (NEEDLE) ×2 IMPLANT
NEEDLE SPNL 18GX3.5 QUINCKE PK (NEEDLE) IMPLANT
NS IRRIG 1000ML POUR BTL (IV SOLUTION) ×2 IMPLANT
PACK LAMINECTOMY NEURO (CUSTOM PROCEDURE TRAY) ×2 IMPLANT
PAD ARMBOARD 7.5X6 YLW CONV (MISCELLANEOUS) ×6 IMPLANT
PATTIES SURGICAL .5 X.5 (GAUZE/BANDAGES/DRESSINGS) IMPLANT
PATTIES SURGICAL .5 X1 (DISPOSABLE) IMPLANT
PATTIES SURGICAL 1X1 (DISPOSABLE) IMPLANT
ROD 35MM (Rod) ×4 IMPLANT
SCREW 50MM (Screw) ×8 IMPLANT
SCREW SET SPINAL STD HEXALOBE (Screw) ×8 IMPLANT
SPONGE GAUZE 4X4 12PLY (GAUZE/BANDAGES/DRESSINGS) ×2 IMPLANT
SPONGE LAP 4X18 X RAY DECT (DISPOSABLE) IMPLANT
SPONGE SURGIFOAM ABS GEL 100 (HEMOSTASIS) ×2 IMPLANT
STAPLER SKIN PROX WIDE 3.9 (STAPLE) IMPLANT
STRIP CLOSURE SKIN 1/2X4 (GAUZE/BANDAGES/DRESSINGS) ×2 IMPLANT
SUT VIC AB 1 CT1 18XBRD ANBCTR (SUTURE) ×1 IMPLANT
SUT VIC AB 1 CT1 8-18 (SUTURE) ×1
SUT VIC AB 2-0 CT1 18 (SUTURE) ×2 IMPLANT
SUT VIC AB 3-0 SH 8-18 (SUTURE) ×2 IMPLANT
SYR 20ML ECCENTRIC (SYRINGE) ×2 IMPLANT
SYR 3ML LL SCALE MARK (SYRINGE) ×4 IMPLANT
SYR 5ML LL (SYRINGE) IMPLANT
TAPE CLOTH SURG 4X10 WHT LF (GAUZE/BANDAGES/DRESSINGS) ×2 IMPLANT
TOWEL OR 17X24 6PK STRL BLUE (TOWEL DISPOSABLE) ×2 IMPLANT
TOWEL OR 17X26 10 PK STRL BLUE (TOWEL DISPOSABLE) ×2 IMPLANT
TRAP SPECIMEN MUCOUS 40CC (MISCELLANEOUS) ×2 IMPLANT
TRAY FOLEY CATH 14FRSI W/METER (CATHETERS) ×2 IMPLANT
WATER STERILE IRR 1000ML POUR (IV SOLUTION) ×2 IMPLANT

## 2012-09-19 NOTE — OR Nursing (Signed)
Pt's chin is reddened upon arrival to PACU; Dr. Sampson Goon here and aware.

## 2012-09-19 NOTE — Preoperative (Signed)
Beta Blockers   Reason not to administer Beta Blockers:Not Applicable 

## 2012-09-19 NOTE — Op Note (Signed)
09/19/2012  11:15 AM  PATIENT:  Joshua Nielsen  57 y.o. male  PRE-OPERATIVE DIAGNOSIS:  Lumbar spondylosis, Lumbar spondylolisthesis, Recurrent lumbar disc herniation, Lumbar radiculopathy L 45  POST-OPERATIVE DIAGNOSIS:   Lumbar spondylosis, Lumbar spondylolisthesis, Recurrent lumbar disc herniation, Lumbar radiculopathy L 45  PROCEDURE:  Procedure(s) with comments: Redo Lumbar Four to Lumbar Five Laminectomy with Decompression/Fusion (N/A) - POSTERIOR LUMBAR FUSION 1 LEVEL PEEK cages, autograft, allograft, pedicle screw fixation, posterolateral arthrodesis  SURGEON:  Surgeon(s) and Role:    * Marlyss Cissell, MD - Primary  PHYSICIAN ASSISTANT: Hirsch, MD  ASSISTANTS: Poteat, RN   ANESTHESIA:   general  EBL:  Total I/O In: 2290 [I.V.:2000; Blood:290] Out: 975 [Urine:625; Blood:350]  BLOOD ADMINISTERED:250 CC PRBC  DRAINS: (Medium) Hemovact drain(s) in the epidural space with  Suction Open   LOCAL MEDICATIONS USED:  MARCAINE     SPECIMEN:  No Specimen  DISPOSITION OF SPECIMEN:  N/A  COUNTS:  YES  TOURNIQUET:  * No tourniquets in log *  DICTATION: Patient is 57-year-old man with retrolisthesis of L4 on L5 with lumbar stenosis and recurrent disc herniation. He has a severe right L5 radiculopathy. It was elected to take him to surgery for redo decompression and fusion at this level.   Procedure: Patient was placed in a prone position on the Jackson table after smooth and uncomplicated induction of general endotracheal anesthesia. His low back was prepped and draped in usual sterile fashion with betadine scrub and DuraPrep. Area of incision was infiltrated with local lidocaine. Incision was made to the lumbodorsal fascia was incised and exposure was performed of the L4 through L5 spinous processes laminae facet joint and transverse processes. Intraoperative x-ray was obtained which confirmed correct orientation. A total laminectomy of L4 was performed with disarticulation of the  facet joints at this level and thorough decompression was performed of both L4 and L5 nerve roots along with the common dural tube after carefully dissecting the scarred in neural elements on the right through area of previous laminectomy. This decompression was more involved than would be typical of that performed for PLIF alone and included painstaking dissection of adherent ligament compressing the thecal sac and wide decompression of all neural elements. A thorough discectomy was initially performed on the left with preparation of the endplates for grafting a trial spacer was placed this level and a thorough discectomy was performed on the right as well. Bone autograft was packed within the interspace bilaterally along with small BMP kit and NexOss bone graft extender. Bilateral medium 11mm peek cages were packed with BMP and extender and inserted the interspace and countersunk appropriately along with 7.5 cc of morselized bone autograft. The posterolateral region was extensively decorticated and pedicle probes were placed at L4 and L5 bilaterally. Intraoperative fluoroscopy confirmed correct orientationin the AP and lateral plane. 50 x 6.5 mm pedicle screws were placed at L5 bilaterally and 50 x 6.5 mm screws placed at L4 bilaterally final x-rays demonstrated well-positioned interbody grafts and pedicle screw fixation. A 35 mm lordotic rod was placed on the right and a 35 mm rod was placed on the left locked down in situ and the posterolateral region was packed with the remaining autograft and bone graft extender bilaterally. The wound was irrigated and a medium Hemovac drain was placed in the epidural space. Fascia was closed with 1 Vicryl sutures skin edges were reapproximated 2 and 3-0 Vicryl sutures. The wound is dressed with benzoin Steri-Strips Telfa gauze and tape the patient was extubated   in the operating room and taken to recovery in stable satisfactory condition. She tolerated the operation well  counts were correct at the end of the case.   PLAN OF CARE: Admit to inpatient   PATIENT DISPOSITION:  PACU - hemodynamically stable.   Delay start of Pharmacological VTE agent (>24hrs) due to surgical blood loss or risk of bleeding: yes  

## 2012-09-19 NOTE — Anesthesia Procedure Notes (Signed)
Procedure Name: Intubation Date/Time: 09/19/2012 7:35 AM Performed by: Carmela Rima Pre-anesthesia Checklist: Patient identified, Timeout performed, Emergency Drugs available, Suction available and Patient being monitored Patient Re-evaluated:Patient Re-evaluated prior to inductionOxygen Delivery Method: Circle system utilized Preoxygenation: Pre-oxygenation with 100% oxygen Intubation Type: IV induction Ventilation: Mask ventilation without difficulty Laryngoscope Size: Mac and 3 Grade View: Grade II Tube type: Oral Tube size: 7.5 mm Number of attempts: 1 Placement Confirmation: ETT inserted through vocal cords under direct vision,  positive ETCO2 and breath sounds checked- equal and bilateral Secured at: 23 cm Tube secured with: Tape Dental Injury: Teeth and Oropharynx as per pre-operative assessment

## 2012-09-19 NOTE — Interval H&P Note (Signed)
History and Physical Interval Note:  09/19/2012 4:19 AM  Joshua Nielsen  has presented today for surgery, with the diagnosis of Lumbar spondylosis, Lumbar degenerative disc disease, Lumbar radiculopathy, Lumbago  The various methods of treatment have been discussed with the patient and family. After consideration of risks, benefits and other options for treatment, the patient has consented to  Procedure(s) with comments: POSTERIOR LUMBAR FUSION 1 LEVEL (N/A) - Redo L4-5 Laminectomy with Decompression/Fusion as a surgical intervention .  The patient's history has been reviewed, patient examined, no change in status, stable for surgery.  I have reviewed the patient's chart and labs.  Questions were answered to the patient's satisfaction.     Kerra Guilfoil D

## 2012-09-19 NOTE — Anesthesia Preprocedure Evaluation (Addendum)
Anesthesia Evaluation  Patient identified by MRN, date of birth, ID band Patient awake    Reviewed: Allergy & Precautions, H&P , NPO status , Patient's Chart, lab work & pertinent test results  History of Anesthesia Complications (+) PONV  Airway Mallampati: II TM Distance: >3 FB Neck ROM: Full    Dental no notable dental hx. (+) Teeth Intact and Dental Advisory Given   Pulmonary sleep apnea and Continuous Positive Airway Pressure Ventilation ,  breath sounds clear to auscultation  Pulmonary exam normal       Cardiovascular hypertension, On Medications Rhythm:Regular Rate:Normal     Neuro/Psych negative neurological ROS  negative psych ROS   GI/Hepatic negative GI ROS, Neg liver ROS,   Endo/Other  diabetes, Type 2, Oral Hypoglycemic AgentsHypothyroidism   Renal/GU negative Renal ROS  negative genitourinary   Musculoskeletal   Abdominal   Peds  Hematology negative hematology ROS (+)   Anesthesia Other Findings   Reproductive/Obstetrics negative OB ROS                          Anesthesia Physical Anesthesia Plan  ASA: III  Anesthesia Plan: General   Post-op Pain Management:    Induction: Intravenous  Airway Management Planned: Oral ETT  Additional Equipment:   Intra-op Plan:   Post-operative Plan: Extubation in OR  Informed Consent: I have reviewed the patients History and Physical, chart, labs and discussed the procedure including the risks, benefits and alternatives for the proposed anesthesia with the patient or authorized representative who has indicated his/her understanding and acceptance.   Dental advisory given and Dental Advisory Given  Plan Discussed with: CRNA, Surgeon and Anesthesiologist  Anesthesia Plan Comments:        Anesthesia Quick Evaluation

## 2012-09-19 NOTE — Anesthesia Postprocedure Evaluation (Signed)
  Anesthesia Post-op Note  Patient: Joshua Nielsen  Procedure(s) Performed: Procedure(s) with comments: Redo Lumbar Four to Lumbar Five Laminectomy with Decompression/Fusion (N/A) - POSTERIOR LUMBAR FUSION 1 LEVEL  Patient Location: PACU  Anesthesia Type:General  Level of Consciousness: awake and alert   Airway and Oxygen Therapy: Patient Spontanous Breathing and Patient connected to nasal cannula oxygen  Post-op Pain: mild  Post-op Assessment: Post-op Vital signs reviewed, Patient's Cardiovascular Status Stable, Respiratory Function Stable, Patent Airway and No signs of Nausea or vomiting  Post-op Vital Signs: Reviewed and stable  Complications: No apparent anesthesia complications

## 2012-09-19 NOTE — Brief Op Note (Signed)
09/19/2012  11:15 AM  PATIENT:  Joshua Nielsen  57 y.o. male  PRE-OPERATIVE DIAGNOSIS:  Lumbar spondylosis, Lumbar spondylolisthesis, Recurrent lumbar disc herniation, Lumbar radiculopathy L 45  POST-OPERATIVE DIAGNOSIS:   Lumbar spondylosis, Lumbar spondylolisthesis, Recurrent lumbar disc herniation, Lumbar radiculopathy L 45  PROCEDURE:  Procedure(s) with comments: Redo Lumbar Four to Lumbar Five Laminectomy with Decompression/Fusion (N/A) - POSTERIOR LUMBAR FUSION 1 LEVEL PEEK cages, autograft, allograft, pedicle screw fixation, posterolateral arthrodesis  SURGEON:  Surgeon(s) and Role:    * Maeola Harman, MD - Primary  PHYSICIAN ASSISTANT: Phoebe Perch, MD  ASSISTANTS: P[place], RN   ANESTHESIA:   general  EBL:  Total I/O In: 2290 [I.V.:2000; Blood:290] Out: 975 [Urine:625; Blood:350]  BLOOD ADMINISTERED:250 CC PRBC  DRAINS: (Medium) Hemovact drain(s) in the epidural space with  Suction Open   LOCAL MEDICATIONS USED:  MARCAINE     SPECIMEN:  No Specimen  DISPOSITION OF SPECIMEN:  N/A  COUNTS:  YES  TOURNIQUET:  * No tourniquets in log *  DICTATION: Patient is 57 year old man with retrolisthesis of L4 on L5 with lumbar stenosis and recurrent disc herniation. He has a severe right L5 radiculopathy. It was elected to take him to surgery for redo decompression and fusion at this level.   Procedure: Patient was placed in a prone position on the [place] table after smooth and uncomplicated induction of general endotracheal anesthesia. His low back was prepped and draped in usual sterile fashion with betadine scrub and DuraPrep. Area of incision was infiltrated with local lidocaine. Incision was made to the lumbodorsal fascia was incised and exposure was performed of the L4 through L5 spinous processes laminae facet joint and transverse processes. Intraoperative x-ray was obtained which confirmed correct orientation. A total laminectomy of L4 was performed with disarticulation of the  facet joints at this level and thorough decompression was performed of both L4 and L5 nerve roots along with the common dural tube after carefully dissecting the scarred in neural elements on the right through area of previous laminectomy. This decompression was more involved than would be typical of that performed for PLIF alone and included painstaking dissection of adherent ligament compressing the thecal sac and wide decompression of all neural elements. A thorough discectomy was initially performed on the left with preparation of the endplates for grafting a trial spacer was placed this level and a thorough discectomy was performed on the right as well. Bone autograft was packed within the interspace bilaterally along with small BMP kit and NexOss bone graft extender. Bilateral medium 11mm peek cages were packed with BMP and extender and inserted the interspace and countersunk appropriately along with 7.5 cc of morselized bone autograft. The posterolateral region was extensively decorticated and pedicle probes were placed at L4 and L5 bilaterally. Intraoperative fluoroscopy confirmed correct orientationin the AP and lateral plane. 50 x 6.5 mm pedicle screws were placed at L5 bilaterally and 50 x 6.5 mm screws placed at L4 bilaterally final x-rays demonstrated well-positioned interbody grafts and pedicle screw fixation. A 35 mm lordotic rod was placed on the right and a 35 mm rod was placed on the left locked down in situ and the posterolateral region was packed with the remaining autograft and bone graft extender bilaterally. The wound was irrigated and a medium Hemovac drain was placed in the epidural space. Fascia was closed with 1 Vicryl sutures skin edges were reapproximated 2 and 3-0 Vicryl sutures. The wound is dressed with benzoin Steri-Strips Telfa gauze and tape the patient was extubated  in the operating room and taken to recovery in stable satisfactory condition. She tolerated the operation well  counts were correct at the end of the case.   PLAN OF CARE: Admit to inpatient   PATIENT DISPOSITION:  PACU - hemodynamically stable.   Delay start of Pharmacological VTE agent (>24hrs) due to surgical blood loss or risk of bleeding: yes

## 2012-09-19 NOTE — Progress Notes (Signed)
Awake, alert, conversant.  Full strength both legs.  Doing well.  

## 2012-09-19 NOTE — Transfer of Care (Signed)
Immediate Anesthesia Transfer of Care Note  Patient: Joshua Nielsen  Procedure(s) Performed: Procedure(s) with comments: Redo Lumbar Four to Lumbar Five Laminectomy with Decompression/Fusion (N/A) - POSTERIOR LUMBAR FUSION 1 LEVEL  Patient Location: PACU  Anesthesia Type:General  Level of Consciousness: sedated  Airway & Oxygen Therapy: Patient Spontanous Breathing and Patient connected to nasal cannula oxygen  Post-op Assessment: Report given to PACU RN and Post -op Vital signs reviewed and stable  Post vital signs: Reviewed and stable  Complications: No apparent anesthesia complications

## 2012-09-19 NOTE — Progress Notes (Signed)
Verified using Dilaudid post op for pain in patient with OSA; verified with Dr. Sampson Goon.

## 2012-09-19 NOTE — Progress Notes (Signed)
UR complete.  Laterria Lasota RN, MSN 

## 2012-09-20 LAB — GLUCOSE, CAPILLARY: Glucose-Capillary: 103 mg/dL — ABNORMAL HIGH (ref 70–99)

## 2012-09-20 MED ORDER — PANTOPRAZOLE SODIUM 40 MG PO TBEC
40.0000 mg | DELAYED_RELEASE_TABLET | Freq: Every day | ORAL | Status: DC
  Administered 2012-09-20 – 2012-09-22 (×3): 40 mg via ORAL
  Filled 2012-09-20 (×3): qty 1

## 2012-09-20 NOTE — Progress Notes (Signed)
09/20/12 0016  BiPAP/CPAP/SIPAP  BiPAP/CPAP/SIPAP Pt Type Adult  Mask Type Full face mask  Mask Size Medium  Set Rate 0 breaths/min  Respiratory Rate 16 breaths/min  IPAP 12 cmH20  Flow Rate 3 lpm  BiPAP/CPAP/SIPAP CPAP  Patient Home Equipment No  Auto Titrate No  Patient placed on CPAP 12 per MD order. He uses his own Full Face Mask with 3l oxygen bleed in.

## 2012-09-20 NOTE — Evaluation (Signed)
Physical Therapy Evaluation Patient Details Name: Joshua Nielsen MRN: [id number] DOB: 31-Aug-1955 Today's Date: 09/20/2012 Time: 9528-4132 PT Time Calculation (min): 38 min  PT Assessment / Plan / Recommendation Clinical Impression  pt s/p lumbar decomp/fusion redo.  On eval, pt was painful with resultant minor weaknesses.  Instructed or reinforced pt /wife on all back education/precautions.  Pt can benefit from PT to maximize I and function.    PT Assessment  Patient needs continued PT services    Follow Up Recommendations  Home health PT    Does the patient have the potential to tolerate intense rehabilitation      Barriers to Discharge        Equipment Recommendations  Rolling walker with 5" wheels    Recommendations for Other Services     Frequency Min 5X/week    Precautions / Restrictions Precautions Precautions: Back;Fall Precaution Booklet Issued: Yes (comment) Precaution Comments: Educated pt and family in back precautions. Required Braces or Orthoses: Spinal Brace Spinal Brace: Lumbar corset;Applied in sitting position   Pertinent Vitals/Pain       Mobility  Bed Mobility Bed Mobility: Rolling Right;Right Sidelying to Sit;Sitting - Scoot to Edge of Bed Rolling Right: 4: Min assist Right Sidelying to Sit: 4: Min assist Sitting - Scoot to Edge of Bed: 5: Supervision Details for Bed Mobility Assistance: verbal cues for log roll technique Transfers Transfers: Sit to Stand;Stand to Sit Sit to Stand: 4: Min guard;With upper extremity assist;From bed;From chair/3-in-1 Stand to Sit: 4: Min guard;With upper extremity assist;To chair/3-in-1 Details for Transfer Assistance: verbal cues for hand placement Ambulation/Gait Ambulation/Gait Assistance: 5: Supervision Ambulation Distance (Feet): 150 Feet Assistive device: Rolling walker Ambulation/Gait Assistance Details: generally steady, but guarded Gait Pattern: Step-through pattern Gait velocity: slower Stairs:  No Wheelchair Mobility Wheelchair Mobility: No    Exercises     PT Diagnosis: Acute pain  PT Problem List: Decreased activity tolerance;Decreased mobility;Decreased knowledge of use of DME;Decreased knowledge of precautions;Pain PT Treatment Interventions:     PT Goals Acute Rehab PT Goals PT Goal Formulation: With patient Time For Goal Achievement: 09/27/12 Potential to Achieve Goals: Good Pt will go Supine/Side to Sit: with supervision;with HOB 0 degrees PT Goal: Supine/Side to Sit - Progress: Goal set today Pt will go Sit to Stand: with supervision PT Goal: Sit to Stand - Progress: Goal set today Pt will Transfer Bed to Chair/Chair to Bed: with supervision PT Transfer Goal: Bed to Chair/Chair to Bed - Progress: Goal set today Pt will Ambulate: >150 feet;with supervision;with least restrictive assistive device PT Goal: Ambulate - Progress: Goal set today Pt will Go Up / Down Stairs: 3-5 stairs;with supervision;with rail(s) PT Goal: Up/Down Stairs - Progress: Goal set today  Visit Information  Last PT Received On: 09/20/12 Assistance Needed: +1 PT/OT Co-Evaluation/Treatment: Yes    Subjective Data  Patient Stated Goal: Home,, back to work   Prior Functioning  Home Living Lives With: Spouse Available Help at Discharge: Family;Available 24 hours/day Type of Home: House Home Access: Ramped entrance Home Layout: One level Bathroom Shower/Tub: Tub/shower unit;Curtain Bathroom Toilet: Handicapped height Home Adaptive Equipment: Hand-held shower hose;Bedside commode/3-in-1 Prior Function Level of Independence: Independent Able to Take Stairs?: Yes Driving: Yes Vocation: Full time employment Communication Communication: No difficulties Dominant Hand: Right    Cognition  Cognition Arousal/Alertness: Awake/alert Behavior During Therapy: WFL for tasks assessed/performed Overall Cognitive Status: Within Functional Limits for tasks assessed    Extremity/Trunk Assessment  Right Upper Extremity Assessment RUE ROM/Strength/Tone: [facility] for tasks  assessed RUE Coordination: WFL - gross/fine motor Left Upper Extremity Assessment LUE ROM/Strength/Tone: WFL for tasks assessed LUE Coordination: WFL - gross/fine motor Right Lower Extremity Assessment RLE ROM/Strength/Tone: Within functional levels RLE Coordination: WFL - gross/fine motor Left Lower Extremity Assessment LLE ROM/Strength/Tone: Within functional levels LLE Coordination: WFL - gross/fine motor Trunk Assessment Trunk Assessment: Normal   Balance Balance Balance Assessed: No  End of Session PT - End of Session Equipment Utilized During Treatment: Back brace Activity Tolerance: Patient limited by pain;Patient tolerated treatment well Patient left: in chair;with call bell/phone within reach;with family/visitor present Nurse Communication: Mobility status  GP     Deward Sebek, Eliseo Gum 09/20/2012, 1:56 PM 09/20/2012  Dunnavant Bing, PT [phone]  (pager)

## 2012-09-20 NOTE — Progress Notes (Signed)
CSW awaiting physical therapy evaluation to determine pt disposition needs. .Clinical social worker continuing to follow pt to assist with pt dc plans and further csw needs.   Catha Gosselin, LCSWA  Covering weekend [phone] 09/20/2012 1240pm

## 2012-09-20 NOTE — Evaluation (Signed)
Occupational Therapy Evaluation Patient Details Name: Joshua Nielsen MRN: [id number] DOB: 1955/05/30 Today's Date: 09/20/2012 Time: 7829-5621 OT Time Calculation (min): 38 min  OT Assessment / Plan / Recommendation Clinical Impression  Pt is recovering from PLIF.  Moving well POD 1.  Will have assist of family upon d/c home.  Will follow acutely to instruct in back precautions related to ADL and ADL transfers.  Do not anticipate pt will need OT post hospitalization.    OT Assessment  Patient needs continued OT Services    Follow Up Recommendations  No OT follow up    Barriers to Discharge      Equipment Recommendations   (RW )    Recommendations for Other Services    Frequency  Min 2X/week    Precautions / Restrictions Precautions Precautions: Back;Fall Precaution Booklet Issued: Yes (comment) Precaution Comments: Educated pt and family in back precautions. Required Braces or Orthoses: Spinal Brace Spinal Brace: Lumbar corset;Applied in sitting position   Pertinent Vitals/Pain 5/10 back, PCA, repositioned    ADL  Eating/Feeding: Independent Where Assessed - Eating/Feeding: Chair Grooming: Wash/dry hands;Set up Where Assessed - Grooming: Supported sitting Upper Body Bathing: Set up Where Assessed - Upper Body Bathing: Unsupported sitting Lower Body Bathing: Maximal assistance Where Assessed - Lower Body Bathing: Unsupported sitting;Supported sit to stand Upper Body Dressing: Set up Where Assessed - Upper Body Dressing: Unsupported sitting Lower Body Dressing: Maximal assistance Where Assessed - Lower Body Dressing: Unsupported sitting;Supported sit to stand Toilet Transfer: Min Pension scheme manager Method: Sit to Barista: Raised toilet seat with arms (or 3-in-1 over toilet) Toileting - Clothing Manipulation and Hygiene: Min guard Where Assessed - Glass blower/designer Manipulation and Hygiene: Standing Equipment Used: Back brace;Rolling  walker Transfers/Ambulation Related to ADLs: min guard assist with RW, verbal cues for technique ADL Comments: Pt is not able to cross his foot over opposite knee to access feet.    OT Diagnosis: Generalized weakness;Acute pain  OT Problem List: Decreased strength;Impaired balance (sitting and/or standing);Decreased knowledge of use of DME or AE;Decreased knowledge of precautions;Pain OT Treatment Interventions: Self-care/ADL training;DME and/or AE instruction;Patient/family education   OT Goals Acute Rehab OT Goals OT Goal Formulation: With patient Time For Goal Achievement: 09/27/12 Potential to Achieve Goals: Good ADL Goals Pt Will Perform Grooming: with modified independence;Standing at sink ADL Goal: Grooming - Progress: Goal set today Pt Will Perform Lower Body Bathing: with modified independence;Sitting, edge of bed;Sit to stand from bed;with adaptive equipment ADL Goal: Lower Body Bathing - Progress: Goal set today Pt Will Perform Lower Body Dressing: with modified independence;Sitting, bed;Sit to stand from bed;with adaptive equipment ADL Goal: Lower Body Dressing - Progress: Goal set today Pt Will Transfer to Toilet: with modified independence;Ambulation;with DME;Maintaining back safety precautions ADL Goal: Toilet Transfer - Progress: Goal set today Pt Will Perform Toileting - Clothing Manipulation: with modified independence;Sitting on 3-in-1 or toilet ADL Goal: Toileting - Clothing Manipulation - Progress: Goal set today Pt Will Perform Toileting - Hygiene: with modified independence;Sitting on 3-in-1 or toilet ADL Goal: Toileting - Hygiene - Progress: Goal set today Pt Will Perform Tub/Shower Transfer: Tub transfer;Ambulation;Maintaining back safety precautions (determine need for tub equipment) ADL Goal: Tub/Shower Transfer - Progress: Goal set today Miscellaneous OT Goals Miscellaneous OT Goal #1: Pt will generalize back precautions in ADL independently. OT Goal:  Miscellaneous Goal #1 - Progress: Goal set today  Visit Information  Last OT Received On: 09/20/12 Assistance Needed: +1 PT/OT Co-Evaluation/Treatment: Yes    Subjective  Data  Subjective: "I've had neck surgery before." Patient Stated Goal: Home with assist of family.   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) Communication Communication: No difficulties Dominant Hand: Right         Vision/Perception Vision - History Baseline Vision: Wears glasses only for reading Patient Visual Report: No change from baseline   Cognition  Cognition Arousal/Alertness: Awake/alert Behavior During Therapy: WFL for tasks assessed/performed Overall Cognitive Status: Within Functional Limits for tasks assessed    Extremity/Trunk Assessment Right Upper Extremity Assessment RUE ROM/Strength/Tone: WFL for tasks assessed RUE Coordination: WFL - gross/fine motor Left Upper Extremity Assessment LUE ROM/Strength/Tone: WFL for tasks assessed LUE Coordination: WFL - gross/fine motor Trunk Assessment Trunk Assessment: Normal     Mobility Bed Mobility Bed Mobility: Rolling Right;Right Sidelying to Sit;Sitting - Scoot to Edge of Bed Rolling Right: 4: Min assist Right Sidelying to Sit: 4: Min assist Sitting - Scoot to Edge of Bed: 5: Supervision Details for Bed Mobility Assistance: verbal cues for log roll technique Transfers Transfers: Sit to Stand;Stand to Sit Sit to Stand: 4: Min guard;With upper extremity assist;From bed;From  chair/3-in-1 Stand to Sit: 4: Min guard;With upper extremity assist;To chair/3-in-1 Details for Transfer Assistance: verbal cues for hand placement     Exercise     Balance     End of Session OT - End of Session Activity Tolerance: Patient tolerated treatment well Patient left: in chair;with call bell/phone within reach;with family/visitor present;with nursing in room Nurse Communication: Mobility status  GO     Evern Bio 09/20/2012, 1:43 PM [phone]

## 2012-09-20 NOTE — Progress Notes (Signed)
Pt. Wearing CO2 monitor tonight. Will resume cpap tomorrow.

## 2012-09-20 NOTE — Progress Notes (Signed)
Patient ID: Joshua Nielsen, male   DOB: 1956/05/04, 57 y.o.   MRN: [id number] BP 96/64  Pulse 64  Temp(Src) 98 F (36.7 C) (Oral)  Resp 18  Ht 5\' 10"  (1.778 m)  Wt 96.7 kg (213 lb 3 oz)  BMI 30.59 kg/m2  SpO2 98% Alert and oriented Moving lower extremities well Wound dressing is clean, dry Expected pain post op. Will mobilize

## 2012-09-20 NOTE — Progress Notes (Signed)
09/20/2012 1630 NCM spoke to pt and requested [facility] for [facility]. States he believes he has RW at home. Will follow up with pt 5/11. Isidoro Donning RN CCM Case Mgmt phone [phone]

## 2012-09-21 MED ORDER — MORPHINE SULFATE 2 MG/ML IJ SOLN: 2.0000 mg | INTRAMUSCULAR | Status: DC | PRN

## 2012-09-21 NOTE — Progress Notes (Signed)
Doing well. Slowly increasing activity  Temp:  [97.9 F (36.6 C)-98.8 F (37.1 C)] 98.7 F (37.1 C) (05/11 0548) Pulse Rate:  [59-73] 62 (05/11 0548) Resp:  [10-20] 16 (05/11 0739) BP: (109-136)/(59-81) 116/79 mmHg (05/11 0548) SpO2:  [94 %-100 %] 98 % (05/11 0739) FiO2 (%):  [98 %] 98 % (05/11 0739) Good strength and sensation Incision CDI  Plan: D/c pca - increase activity

## 2012-09-21 NOTE — Progress Notes (Signed)
Physical Therapy Treatment Patient Details Name: Joshua Nielsen MRN: [id number] DOB: 05/31/1955 Today's Date: 09/21/2012 Time: 8657-8469 PT Time Calculation (min): 23 min  PT Assessment / Plan / Recommendation Comments on Treatment Session  Recinforced all education, pt verbalized understanding.    Follow Up Recommendations  No PT follow up     Does the patient have the potential to tolerate intense rehabilitation     Barriers to Discharge        Equipment Recommendations       Recommendations for Other Services    Frequency Min 5X/week   Plan Discharge plan remains appropriate;Frequency remains appropriate    Precautions / Restrictions Precautions Precautions: Back;Fall Required Braces or Orthoses: Spinal Brace Spinal Brace: Lumbar corset;Applied in sitting position   Pertinent Vitals/Pain 3-4/10 pain     Mobility  Bed Mobility Bed Mobility: Not assessed (Reinforced logroll technique and transition to/from supine) Transfers Transfers: Sit to Stand;Stand to Sit Sit to Stand: 5: Supervision;With upper extremity assist;From chair/3-in-1 Stand to Sit: 5: Supervision;With upper extremity assist;To chair/3-in-1 Details for Transfer Assistance: safe mobility Ambulation/Gait Ambulation/Gait Assistance: 5: Supervision Ambulation Distance (Feet): 700 Feet Assistive device: Rolling walker;None Ambulation/Gait Assistance Details: Initially wary of amb without assist of RW, but pt found himself to be steady without use of the assistive device Gait Pattern: Within Functional Limits Gait velocity: slower, but can increase speed when pushed Stairs: Yes Stairs Assistance: 5: Supervision Stair Management Technique: One rail Right;Alternating pattern;Forwards Number of Stairs: 6 Wheelchair Mobility Wheelchair Mobility: No    Exercises     PT Diagnosis:    PT Problem List:   PT Treatment Interventions:     PT Goals Acute Rehab PT Goals Time For Goal Achievement:  09/27/12 Potential to Achieve Goals: Good Pt will go Supine/Side to Sit: with supervision;with HOB 0 degrees Pt will go Sit to Stand: with supervision PT Goal: Sit to Stand - Progress: Partly met Pt will Transfer Bed to Chair/Chair to Bed: with supervision PT Transfer Goal: Bed to Chair/Chair to Bed - Progress: Partly met Pt will Ambulate: >150 feet;with supervision;with least restrictive assistive device PT Goal: Ambulate - Progress: Met Pt will Go Up / Down Stairs: 3-5 stairs;with supervision;with rail(s) PT Goal: Up/Down Stairs - Progress: Partly met  Visit Information  Last PT Received On: 09/21/12 Assistance Needed: +1    Subjective Data  Subjective: No I don't really have any questions   Cognition  Cognition Arousal/Alertness: Awake/alert Behavior During Therapy: WFL for tasks assessed/performed Overall Cognitive Status: Within Functional Limits for tasks assessed    Balance  Balance Balance Assessed: No  End of Session PT - End of Session Equipment Utilized During Treatment: Back brace Activity Tolerance: Patient tolerated treatment well Patient left: in chair;with call bell/phone within reach;with family/visitor present Nurse Communication: Mobility status   GP     Joshua Nielsen, Joshua Nielsen 09/21/2012, 12:58 PM

## 2012-09-22 LAB — GLUCOSE, CAPILLARY

## 2012-09-22 MED ORDER — DIAZEPAM 5 MG PO TABS: 5.0000 mg | ORAL_TABLET | Freq: Four times a day (QID) | ORAL | Status: DC | PRN

## 2012-09-22 MED ORDER — FLEET ENEMA 7-19 GM/118ML RE ENEM
1.0000 | ENEMA | Freq: Once | RECTAL | Status: AC
  Administered 2012-09-22: 1 via RECTAL
  Filled 2012-09-22: qty 1

## 2012-09-22 MED ORDER — OXYCODONE-ACETAMINOPHEN 5-325 MG PO TABS: 1.0000 | ORAL_TABLET | ORAL | Status: DC | PRN

## 2012-09-22 NOTE — Progress Notes (Signed)
Occupational Therapy Treatment Patient Details Name: Joshua Nielsen MRN: [id number] DOB: [birth year] Today's Date: 09/22/2012 Time: 4098-1191 OT Time Calculation (min): 18 min  OT Assessment / Plan / Recommendation Comments on Treatment Session Pt is at adequate level for d/c home with wife. Pt has completed all OT education. Pt declined to dress for home during session due to pending bathroom needs.     Follow Up Recommendations  No OT follow up    Barriers to Discharge       Equipment Recommendations  None recommended by OT    Recommendations for Other Services    Frequency Min 2X/week   Plan Discharge plan remains appropriate    Precautions / Restrictions Precautions Precautions: Back;Fall Precaution Comments: verbalized 3 out 3 precautions this session Required Braces or Orthoses: Spinal Brace Spinal Brace: Lumbar corset;Applied in sitting position Restrictions Weight Bearing Restrictions: No   Pertinent Vitals/Pain No pain reported    ADL  Lower Body Dressing: Modified independent (demonstrates bil LE cross for LB adls) Where Assessed - Lower Body Dressing: Supported sitting Toilet Transfer: Modified independent Toilet Transfer Method: Sit to Barista: Raised toilet seat with arms (or 3-in-1 over toilet) Equipment Used: Back brace Transfers/Ambulation Related to ADLs: Pt ambulated without RW from chair to tub. pt with decr gait velocity.  ADL Comments: Pt demonstrates ability to cross biL le for LB dressing. NO ae needed. Pt has wife assistance at home 24/7. Pt demonstrates tub transfer with no need for DME. Pt able to use teach back method for back precautions. Pt educated on never wearing brace on bare skin or sleeping in brace. Pt also educated that pt should change positions every 45 minutes to an hour to prevent muscle spasms. Pt reminded to take medication as prescribed upon d/c to help manage pain . Pt and wife educated that return to supine  during the day for rest breaks are still important and that patient shoudl not sit up for 8 hours at this point.     OT Diagnosis:    OT Problem List:   OT Treatment Interventions:     OT Goals Acute Rehab OT Goals OT Goal Formulation: With patient Time For Goal Achievement: 09/27/12 Potential to Achieve Goals: Good ADL Goals Pt Will Perform Grooming: with modified independence;Standing at sink ADL Goal: Grooming - Progress: Met Pt Will Perform Lower Body Bathing: with modified independence;Sitting, edge of bed;Sit to stand from bed;with adaptive equipment ADL Goal: Lower Body Bathing - Progress: Met Pt Will Perform Lower Body Dressing: with modified independence;Sitting, bed;Sit to stand from bed;with adaptive equipment ADL Goal: Lower Body Dressing - Progress: Met Pt Will Transfer to Toilet: with modified independence;Ambulation;with DME;Maintaining back safety precautions ADL Goal: Toilet Transfer - Progress: Met Pt Will Perform Toileting - Clothing Manipulation: with modified independence;Sitting on 3-in-1 or toilet ADL Goal: Toileting - Clothing Manipulation - Progress: Met Pt Will Perform Toileting - Hygiene: with modified independence;Sitting on 3-in-1 or toilet ADL Goal: Toileting - Hygiene - Progress: Met Pt Will Perform Tub/Shower Transfer: Tub transfer;Ambulation;Maintaining back safety precautions ADL Goal: Tub/Shower Transfer - Progress: Met Miscellaneous OT Goals Miscellaneous OT Goal #1: Pt will generalize back precautions in ADL independently. OT Goal: Miscellaneous Goal #1 - Progress: Met  Visit Information  Last OT Received On: 09/22/12 Assistance Needed: +1    Subjective Data      Prior Functioning       Cognition  Cognition Arousal/Alertness: Awake/alert Behavior During Therapy: [facility] for tasks assessed/performed Overall Cognitive  Status: Within Functional Limits for tasks assessed    Mobility  Bed Mobility Bed Mobility: Not assessed Details for Bed  Mobility Assistance: pt reports not deficits when asked Transfers Sit to Stand: 6: Modified independent (Device/Increase time);With upper extremity assist;From chair/3-in-1 Stand to Sit: 6: Modified independent (Device/Increase time);With upper extremity assist;With armrests;To chair/3-in-1 Details for Transfer Assistance: [facility]     Exercises      Balance Balance Balance Assessed: Yes Static Standing Balance Static Standing - Balance Support: No upper extremity supported;During functional activity Static Standing - Level of Assistance: 6: Modified independent (Device/Increase time) Static Standing - Comment/# of Minutes: ~3 minutes- during tub transfer and education   End of Session OT - End of Session Activity Tolerance: Patient tolerated treatment well Patient left: in chair;with call bell/phone within reach;with family/visitor present Nurse Communication: Mobility status  GO     Lucile Shutters 09/22/2012, 9:34 AM Pager: [phone]

## 2012-09-22 NOTE — Progress Notes (Signed)
Occupational Therapy Discharge Patient Details Name: Joshua Nielsen MRN: [id number] DOB: [birth year] Today's Date: 09/22/2012 Time: 4098-1191 OT Time Calculation (min): 18 min  Patient discharged from OT services secondary to goals met and no further OT needs identified.  Please see latest therapy progress note for current level of functioning and progress toward goals.    Progress and discharge plan discussed with patient and/or caregiver: Patient/Caregiver agrees with plan  GO     Lucile Shutters Pager: [phone]  09/22/2012, 9:35 AM

## 2012-09-22 NOTE — Progress Notes (Signed)
   CARE MANAGEMENT NOTE 09/22/2012  Patient:  Joshua Nielsen, Joshua Nielsen   Account Number:  [id number]  Date Initiated:  09/20/2012  Documentation initiated by:  [facility]  Subjective/Objective Assessment:     Action/Plan:   Anticipated DC Date:  09/23/2012   Anticipated DC Plan:  HOME W HOME HEALTH SERVICES      DC Planning Services  CM consult      [facility] Choice  HOME HEALTH   Choice offered to / List presented to:  C-1 Patient           Status of service:  Completed, signed off Medicare Important Message given?   (If response is "NO", the following Medicare IM given date fields will be blank) Date Medicare IM given:   Date Additional Medicare IM given:    Discharge Disposition:  HOME W HOME HEALTH SERVICES  Per UR Regulation:    If discussed at Long Length of Stay Meetings, dates discussed:    Comments:  09/22/2012 1020 NCM spoke to pt and no DME is needed. NCM reviewed and no HH PT.  Isidoro Donning RN CCM Case Mgmt phone [phone]  09/20/2012 1630 NCM spoke to pt and requested [facility] for [facility]. States he believes he has RW at home. Will follow up with pt 5/11. Isidoro Donning RN CCM Case Mgmt phone [phone]

## 2012-09-22 NOTE — Progress Notes (Signed)
Subjective: Patient reports doing well.  Objective: Vital signs in last 24 hours: Temp:  [97.8 F (36.6 C)-99.2 F (37.3 C)] 97.8 F (36.6 C) (05/12 0558) Pulse Rate:  [72-77] 74 (05/12 0558) Resp:  [14-16] 16 (05/12 0558) BP: (94-122)/(57-80) 122/76 mmHg (05/12 0558) SpO2:  [93 %-100 %] 99 % (05/12 0558) FiO2 (%):  [98 %] 98 % (05/11 0739)  Intake/Output from previous day: 05/11 0701 - 05/12 0700 In: -  Out: 60 [Drains:60] Intake/Output this shift:    Physical Exam: Full strength, up walking in brace.  No leg pain.  Back pain improving.  Lab Results: No results found for this basename: WBC, HGB, HCT, PLT,  in the last 72 hours BMET No results found for this basename: NA, K, CL, CO2, GLUCOSE, BUN, CREATININE, CALCIUM,  in the last 72 hours  Studies/Results: No results found.  Assessment/Plan: D/C home.  F/U 3 weeks in office.    LOS: 3 days    Dorian Heckle, MD 09/22/2012, 7:30 AM

## 2012-09-22 NOTE — Discharge Summary (Signed)
Physician Discharge Summary  Patient ID: Joshua Nielsen MRN: [id number] DOB/AGE: June 18, 1955 57 y.o.  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 *   Discharged Condition: good  Hospital Course: Decompression and fusion L 45 with PCA, gradual mobilization with PT  Consults: None  Significant Diagnostic Studies: None  Treatments: surgery:Decompression and fusion L 45  Discharge Exam: Blood pressure 122/76, pulse 74, temperature 97.8 F (36.6 C), temperature source Oral, resp. rate 16, height 5\' 10"  (1.778 m), weight 96.7 kg (213 lb 3 oz), SpO2 99.00%. Neurologic: Alert and oriented X 3, normal strength and tone. Normal symmetric reflexes. Normal coordination and gait Wound CDI  Disposition: Home     Medication List    STOP taking these medications       ALEVE 220 MG tablet  Generic drug:  naproxen sodium      TAKE these medications       amLODipine-benazepril 10-20 MG per capsule  Commonly known as:  LOTREL  Take 1 capsule by mouth every morning.     diazepam 5 MG tablet  Commonly known as:  VALIUM  Take 1 tablet (5 mg total) by mouth every 6 (six) hours as needed.     levothyroxine 25 MCG tablet  Commonly known as:  SYNTHROID, LEVOTHROID  Take 25 mcg by mouth daily before breakfast.     metFORMIN 500 MG tablet  Commonly known as:  GLUCOPHAGE  Take 500 mg by mouth 2 (two) times daily with a meal.     oxyCODONE-acetaminophen 5-325 MG per tablet  Commonly known as:  PERCOCET/ROXICET  Take 1-2 tablets by mouth every 4 (four) hours as needed.     valsartan-hydrochlorothiazide 320-25 MG per tablet  Commonly known as:  DIOVAN-HCT  Take 1 tablet by mouth every morning.         Signed: Dorian Heckle, MD 09/22/2012, 8:31 AM

## 2012-09-22 NOTE — Progress Notes (Signed)
Physical Therapy Treatment Patient Details Name: Joshua Nielsen MRN: [id number] DOB: [birth year] Today's Date: 09/22/2012 Time:  -     PT Assessment / Plan / Recommendation Comments on Treatment Session  Pt moving well.      Follow Up Recommendations  No PT follow up     Does the patient have the potential to tolerate intense rehabilitation     Barriers to Discharge        Equipment Recommendations   (Pt & wife report they have RW at home)    Recommendations for Other Services    Frequency Min 5X/week   Plan Discharge plan remains appropriate;Frequency remains appropriate    Precautions / Restrictions Precautions Precautions: Back;Fall Precaution Comments: Pt verbalized 3/3 back precautions & maintained with functional mobility Required Braces or Orthoses: Spinal Brace Spinal Brace: Lumbar corset;Applied in sitting position Restrictions Weight Bearing Restrictions: No   Pertinent Vitals/Pain No pain/discomfort reported.  Reports LE's feeling tired at end of ambulation.      Mobility  Transfers Transfers: Sit to Stand;Stand to Sit Sit to Stand: 6: Modified independent (Device/Increase time);With upper extremity assist;From bed Stand to Sit: 6: Modified independent (Device/Increase time);With upper extremity assist;With armrests;To chair/3-in-1 Ambulation/Gait Ambulation/Gait Assistance: 5: Supervision Ambulation Distance (Feet): 600 Feet Assistive device: None Ambulation/Gait Assistance Details: Pt ambulated without AD entire distance.  Slightly guarded/cautious but steady.   Gait Pattern: Within Functional Limits Stairs: Yes Stairs Assistance: 6: Modified independent (Device/Increase time) Stair Management Technique: One rail Right;Step to pattern;Forwards Number of Stairs: 5 (2x's) Wheelchair Mobility Wheelchair Mobility: No      PT Goals Acute Rehab PT Goals Time For Goal Achievement: 09/27/12 Potential to Achieve Goals: Good Pt will go Supine/Side to Sit:  with supervision;with HOB 0 degrees Pt will go Sit to Stand: with supervision PT Goal: Sit to Stand - Progress: Met Pt will Transfer Bed to Chair/Chair to Bed: with supervision Pt will Ambulate: >150 feet;with supervision;with least restrictive assistive device PT Goal: Ambulate - Progress: Met Pt will Go Up / Down Stairs: 3-5 stairs;with supervision;with rail(s) PT Goal: Up/Down Stairs - Progress: Met  Visit Information  Last PT Received On: 09/22/12 Assistance Needed: +1    Subjective Data      Cognition  Cognition Arousal/Alertness: Awake/alert Behavior During Therapy: WFL for tasks assessed/performed Overall Cognitive Status: Within Functional Limits for tasks assessed    Balance  Balance Balance Assessed: Yes Static Standing Balance Static Standing - Balance Support: No upper extremity supported Static Standing - Level of Assistance: 6: Modified independent (Device/Increase time) Static Standing - Comment/# of Minutes: 2 mins  End of Session PT - End of Session Equipment Utilized During Treatment: Back brace Activity Tolerance: Patient tolerated treatment well Patient left: in chair;with call bell/phone within reach;with family/visitor present Nurse Communication: Mobility status     Verdell Face, [place] [phone] 09/22/2012

## 2012-09-24 MED FILL — Heparin Sodium (Porcine) Inj 1000 Unit/ML: INTRAMUSCULAR | Qty: 30 | Status: AC

## 2012-09-24 MED FILL — Sodium Chloride IV Soln 0.9%: INTRAVENOUS | Qty: 2000 | Status: AC

## 2013-10-15 ENCOUNTER — Emergency Department (HOSPITAL_COMMUNITY): Payer: Worker's Compensation

## 2013-10-15 ENCOUNTER — Observation Stay (HOSPITAL_COMMUNITY): Payer: Worker's Compensation | Admitting: Certified Registered"

## 2013-10-15 ENCOUNTER — Inpatient Hospital Stay (HOSPITAL_COMMUNITY)
Admission: EM | Admit: 2013-10-15 | Discharge: 2013-10-17 | DRG: 907 | Disposition: A | Discharge status: Home / Self Care | Payer: Worker's Compensation | Attending: Surgery | Admitting: Surgery

## 2013-10-15 ENCOUNTER — Encounter (HOSPITAL_COMMUNITY): Admission: EM | Disposition: A | Discharge status: Home / Self Care | Payer: Self-pay | Source: Home / Self Care

## 2013-10-15 ENCOUNTER — Encounter (HOSPITAL_COMMUNITY): Payer: Worker's Compensation | Admitting: Certified Registered"

## 2013-10-15 ENCOUNTER — Encounter (HOSPITAL_COMMUNITY): Payer: Self-pay | Admitting: Emergency Medicine

## 2013-10-15 DIAGNOSIS — S59919A Unspecified injury of unspecified forearm, initial encounter: Secondary | ICD-10-CM

## 2013-10-15 DIAGNOSIS — S45102A Unspecified injury of brachial artery, left side, initial encounter: Secondary | ICD-10-CM | POA: Diagnosis present

## 2013-10-15 DIAGNOSIS — W311XXA Contact with metalworking machines, initial encounter: Secondary | ICD-10-CM

## 2013-10-15 DIAGNOSIS — S6990XA Unspecified injury of unspecified wrist, hand and finger(s), initial encounter: Secondary | ICD-10-CM

## 2013-10-15 DIAGNOSIS — S40852A Superficial foreign body of left upper arm, initial encounter: Secondary | ICD-10-CM | POA: Diagnosis present

## 2013-10-15 DIAGNOSIS — T794XXA Traumatic shock, initial encounter: Secondary | ICD-10-CM | POA: Diagnosis present

## 2013-10-15 DIAGNOSIS — Y9269 Other specified industrial and construction area as the place of occurrence of the external cause: Secondary | ICD-10-CM

## 2013-10-15 DIAGNOSIS — S4992XA Unspecified injury of left shoulder and upper arm, initial encounter: Secondary | ICD-10-CM

## 2013-10-15 DIAGNOSIS — S41109A Unspecified open wound of unspecified upper arm, initial encounter: Principal | ICD-10-CM | POA: Diagnosis present

## 2013-10-15 DIAGNOSIS — Z79899 Other long term (current) drug therapy: Secondary | ICD-10-CM

## 2013-10-15 DIAGNOSIS — G5690 Unspecified mononeuropathy of unspecified upper limb: Secondary | ICD-10-CM | POA: Diagnosis present

## 2013-10-15 DIAGNOSIS — I10 Essential (primary) hypertension: Secondary | ICD-10-CM | POA: Diagnosis present

## 2013-10-15 DIAGNOSIS — E119 Type 2 diabetes mellitus without complications: Secondary | ICD-10-CM | POA: Diagnosis present

## 2013-10-15 DIAGNOSIS — G5692 Unspecified mononeuropathy of left upper limb: Secondary | ICD-10-CM | POA: Diagnosis not present

## 2013-10-15 DIAGNOSIS — Z1810 Retained metal fragments, unspecified: Secondary | ICD-10-CM

## 2013-10-15 DIAGNOSIS — Z9289 Personal history of other medical treatment: Secondary | ICD-10-CM

## 2013-10-15 DIAGNOSIS — IMO0002 Reserved for concepts with insufficient information to code with codable children: Secondary | ICD-10-CM | POA: Diagnosis present

## 2013-10-15 DIAGNOSIS — S59909A Unspecified injury of unspecified elbow, initial encounter: Secondary | ICD-10-CM

## 2013-10-15 DIAGNOSIS — S41112A Laceration without foreign body of left upper arm, initial encounter: Secondary | ICD-10-CM | POA: Diagnosis present

## 2013-10-15 DIAGNOSIS — E079 Disorder of thyroid, unspecified: Secondary | ICD-10-CM | POA: Diagnosis present

## 2013-10-15 DIAGNOSIS — D62 Acute posthemorrhagic anemia: Secondary | ICD-10-CM | POA: Diagnosis not present

## 2013-10-15 HISTORY — DX: Other complications of anesthesia, initial encounter: T88.59XA

## 2013-10-15 HISTORY — PX: ARTERY REPAIR: SHX559

## 2013-10-15 HISTORY — DX: Adverse effect of unspecified anesthetic, initial encounter: T41.45XA

## 2013-10-15 HISTORY — DX: Personal history of other medical treatment: Z92.89

## 2013-10-15 HISTORY — PX: WOUND EXPLORATION: SHX6188

## 2013-10-15 HISTORY — PX: I & D EXTREMITY: SHX5045

## 2013-10-15 LAB — PREPARE FRESH FROZEN PLASMA
UNIT DIVISION: 0
Unit division: 0

## 2013-10-15 LAB — URINALYSIS, ROUTINE W REFLEX MICROSCOPIC
Bilirubin Urine: NEGATIVE
Glucose, UA: NEGATIVE mg/dL
Hgb urine dipstick: NEGATIVE
Ketones, ur: NEGATIVE mg/dL
LEUKOCYTES UA: NEGATIVE
Nitrite: NEGATIVE
PROTEIN: NEGATIVE mg/dL
Specific Gravity, Urine: 1.018 (ref 1.005–1.030)
Urobilinogen, UA: 0.2 mg/dL (ref 0.0–1.0)
pH: 6.5 (ref 5.0–8.0)

## 2013-10-15 LAB — COMPREHENSIVE METABOLIC PANEL
ALBUMIN: 3.5 g/dL (ref 3.5–5.2)
ALK PHOS: 61 U/L (ref 39–117)
ALT: 25 U/L (ref 0–53)
AST: 25 U/L (ref 0–37)
BUN: 14 mg/dL (ref 6–23)
CO2: 25 mEq/L (ref 19–32)
Calcium: 9.7 mg/dL (ref 8.4–10.5)
Chloride: 99 mEq/L (ref 96–112)
Creatinine, Ser: 1.09 mg/dL (ref 0.50–1.35)
GFR calc non Af Amer: 74 mL/min — ABNORMAL LOW (ref >90)
GFR, EST AFRICAN AMERICAN: 85 mL/min — AB (ref >90)
Glucose, Bld: 164 mg/dL — ABNORMAL HIGH (ref 70–99)
POTASSIUM: 3.5 meq/L — AB (ref 3.7–5.3)
SODIUM: 142 meq/L (ref 137–147)
Total Bilirubin: 1.3 mg/dL — ABNORMAL HIGH (ref 0.3–1.2)
Total Protein: 7.1 g/dL (ref 6.0–8.3)

## 2013-10-15 LAB — GLUCOSE, CAPILLARY
GLUCOSE-CAPILLARY: 149 mg/dL — AB (ref 70–99)
GLUCOSE-CAPILLARY: 180 mg/dL — AB (ref 70–99)

## 2013-10-15 LAB — CBC
HCT: 31 % — ABNORMAL LOW (ref 39.0–52.0)
HEMATOCRIT: 38.1 % — AB (ref 39.0–52.0)
HEMOGLOBIN: 12.7 g/dL — AB (ref 13.0–17.0)
Hemoglobin: 10.2 g/dL — ABNORMAL LOW (ref 13.0–17.0)
MCH: 31.3 pg (ref 26.0–34.0)
MCH: 30.4 pg (ref 26.0–34.0)
MCHC: 33.3 g/dL (ref 30.0–36.0)
MCHC: 32.9 g/dL (ref 30.0–36.0)
MCV: 93.8 fL (ref 78.0–100.0)
MCV: 92.5 fL (ref 78.0–100.0)
Platelets: 317 10*3/uL (ref 150–400)
Platelets: 247 10*3/uL (ref 150–400)
RBC: 4.06 MIL/uL — ABNORMAL LOW (ref 4.22–5.81)
RBC: 3.35 MIL/uL — AB (ref 4.22–5.81)
RDW: 14.1 % (ref 11.5–15.5)
RDW: 14.5 % (ref 11.5–15.5)
WBC: 10.3 10*3/uL (ref 4.0–10.5)
WBC: 17.5 10*3/uL — ABNORMAL HIGH (ref 4.0–10.5)

## 2013-10-15 LAB — CDS SEROLOGY

## 2013-10-15 LAB — ETHANOL: Alcohol, Ethyl (B): <11 mg/dL (ref 0–11)

## 2013-10-15 LAB — PROTIME-INR
INR: 1.08 (ref 0.00–1.49)
PROTHROMBIN TIME: 13.8 s (ref 11.6–15.2)

## 2013-10-15 LAB — ABO/RH: ABO/RH(D): A POS

## 2013-10-15 SURGERY — WOUND EXPLORATION
Anesthesia: General | Site: Arm Upper | Laterality: Left

## 2013-10-15 MED ORDER — PANTOPRAZOLE SODIUM 40 MG PO TBEC
40.0000 mg | DELAYED_RELEASE_TABLET | Freq: Every day | ORAL | Status: DC
  Administered 2013-10-15: 40 mg via ORAL
  Filled 2013-10-15: qty 1

## 2013-10-15 MED ORDER — OXYCODONE HCL 5 MG PO TABS
ORAL_TABLET | ORAL | Status: AC
  Filled 2013-10-15: qty 2

## 2013-10-15 MED ORDER — POTASSIUM CHLORIDE IN NACL 20-0.45 MEQ/L-% IV SOLN
INTRAVENOUS | Status: DC
  Administered 2013-10-15: 21:00:00 via INTRAVENOUS
  Filled 2013-10-15 (×2): qty 1000

## 2013-10-15 MED ORDER — MIDAZOLAM HCL 5 MG/5ML IJ SOLN
INTRAMUSCULAR | Status: DC | PRN
  Administered 2013-10-15: 2 mg via INTRAVENOUS

## 2013-10-15 MED ORDER — OXYCODONE HCL 5 MG PO TABS
5.0000 mg | ORAL_TABLET | ORAL | Status: DC | PRN
  Administered 2013-10-15: 10 mg via ORAL
  Administered 2013-10-16: 15 mg via ORAL
  Filled 2013-10-15: qty 3

## 2013-10-15 MED ORDER — PHENYLEPHRINE 40 MCG/ML (10ML) SYRINGE FOR IV PUSH (FOR BLOOD PRESSURE SUPPORT)
PREFILLED_SYRINGE | INTRAVENOUS | Status: AC
  Filled 2013-10-15: qty 10

## 2013-10-15 MED ORDER — PROPOFOL 10 MG/ML IV BOLUS
INTRAVENOUS | Status: DC | PRN
  Administered 2013-10-15 (×3): 50 mg via INTRAVENOUS
  Administered 2013-10-15: 150 mg via INTRAVENOUS
  Administered 2013-10-15: 50 mg via INTRAVENOUS

## 2013-10-15 MED ORDER — OXYCODONE HCL 5 MG/5ML PO SOLN: 5.0000 mg | Freq: Once | ORAL | Status: DC | PRN

## 2013-10-15 MED ORDER — ONDANSETRON HCL 4 MG PO TABS: 4.0000 mg | ORAL_TABLET | Freq: Four times a day (QID) | ORAL | Status: DC | PRN

## 2013-10-15 MED ORDER — FENTANYL CITRATE 0.05 MG/ML IJ SOLN
INTRAMUSCULAR | Status: AC
  Filled 2013-10-15: qty 2

## 2013-10-15 MED ORDER — FENTANYL CITRATE 0.05 MG/ML IJ SOLN
INTRAMUSCULAR | Status: AC | PRN
  Administered 2013-10-15: 50 ug via INTRAVENOUS
  Administered 2013-10-15: 25 ug via INTRAVENOUS

## 2013-10-15 MED ORDER — MEPERIDINE HCL 25 MG/ML IJ SOLN: 6.2500 mg | INTRAMUSCULAR | Status: DC | PRN

## 2013-10-15 MED ORDER — HYDROMORPHONE HCL PF 1 MG/ML IJ SOLN
0.2500 mg | INTRAMUSCULAR | Status: DC | PRN
  Administered 2013-10-15: 0.5 mg via INTRAVENOUS
  Administered 2013-10-15: 0.25 mg via INTRAVENOUS

## 2013-10-15 MED ORDER — ONDANSETRON HCL 4 MG/2ML IJ SOLN
4.0000 mg | Freq: Four times a day (QID) | INTRAMUSCULAR | Status: DC | PRN
  Administered 2013-10-16: 4 mg via INTRAVENOUS
  Filled 2013-10-15: qty 2

## 2013-10-15 MED ORDER — FENTANYL CITRATE 0.05 MG/ML IJ SOLN
INTRAMUSCULAR | Status: DC | PRN
  Administered 2013-10-15 (×2): 100 ug via INTRAVENOUS

## 2013-10-15 MED ORDER — MIDAZOLAM HCL 2 MG/2ML IJ SOLN
INTRAMUSCULAR | Status: AC
  Filled 2013-10-15: qty 4

## 2013-10-15 MED ORDER — SUCCINYLCHOLINE CHLORIDE 20 MG/ML IJ SOLN
INTRAMUSCULAR | Status: DC | PRN
  Administered 2013-10-15: 140 mg via INTRAVENOUS

## 2013-10-15 MED ORDER — OXYCODONE HCL 5 MG PO TABS: 5.0000 mg | ORAL_TABLET | Freq: Once | ORAL | Status: DC | PRN

## 2013-10-15 MED ORDER — HYDROMORPHONE HCL PF 1 MG/ML IJ SOLN
0.5000 mg | INTRAMUSCULAR | Status: DC | PRN
  Administered 2013-10-15 – 2013-10-16 (×4): 0.5 mg via INTRAVENOUS
  Filled 2013-10-15 (×4): qty 1

## 2013-10-15 MED ORDER — CEFAZOLIN SODIUM-DEXTROSE 2-3 GM-% IV SOLR
2.0000 g | Freq: Four times a day (QID) | INTRAVENOUS | Status: AC
  Administered 2013-10-15 – 2013-10-16 (×3): 2 g via INTRAVENOUS
  Filled 2013-10-15 (×4): qty 50

## 2013-10-15 MED ORDER — IODIXANOL 320 MG/ML IV SOLN
100.0000 mL | Freq: Once | INTRAVENOUS | Status: AC | PRN
  Administered 2013-10-15: 1 mL via INTRAVENOUS

## 2013-10-15 MED ORDER — ONDANSETRON HCL 4 MG/2ML IJ SOLN
INTRAMUSCULAR | Status: DC | PRN
  Administered 2013-10-15: 4 mg via INTRAVENOUS

## 2013-10-15 MED ORDER — HYDROMORPHONE HCL PF 1 MG/ML IJ SOLN
INTRAMUSCULAR | Status: AC
  Filled 2013-10-15: qty 1

## 2013-10-15 MED ORDER — PROTAMINE SULFATE 10 MG/ML IV SOLN
INTRAVENOUS | Status: DC | PRN
  Administered 2013-10-15: 50 mg via INTRAVENOUS

## 2013-10-15 MED ORDER — MIDAZOLAM HCL 2 MG/2ML IJ SOLN
INTRAMUSCULAR | Status: AC | PRN
  Administered 2013-10-15: 2 mg via INTRAVENOUS
  Administered 2013-10-15: 1 mg via INTRAVENOUS

## 2013-10-15 MED ORDER — LACTATED RINGERS IV SOLN
INTRAVENOUS | Status: DC | PRN
  Administered 2013-10-15 (×3): via INTRAVENOUS

## 2013-10-15 MED ORDER — FENTANYL CITRATE 0.05 MG/ML IJ SOLN
INTRAMUSCULAR | Status: AC
  Filled 2013-10-15: qty 4

## 2013-10-15 MED ORDER — SODIUM CHLORIDE 0.9 % IR SOLN
Status: DC | PRN
  Administered 2013-10-15: 1000 mL

## 2013-10-15 MED ORDER — CEFAZOLIN SODIUM-DEXTROSE 2-3 GM-% IV SOLR
INTRAVENOUS | Status: AC
  Administered 2013-10-15: 2 g via INTRAVENOUS
  Filled 2013-10-15: qty 50

## 2013-10-15 MED ORDER — PROPOFOL 10 MG/ML IV BOLUS
INTRAVENOUS | Status: AC
Start: 2013-10-15 — End: 2013-10-15
  Filled 2013-10-15: qty 20

## 2013-10-15 MED ORDER — ONDANSETRON HCL 4 MG/2ML IJ SOLN
INTRAMUSCULAR | Status: AC
  Filled 2013-10-15: qty 2

## 2013-10-15 MED ORDER — MIDAZOLAM HCL 2 MG/2ML IJ SOLN
INTRAMUSCULAR | Status: AC
  Filled 2013-10-15: qty 2

## 2013-10-15 MED ORDER — PANTOPRAZOLE SODIUM 40 MG IV SOLR
40.0000 mg | Freq: Every day | INTRAVENOUS | Status: DC
  Filled 2013-10-15: qty 40

## 2013-10-15 MED ORDER — ARTIFICIAL TEARS OP OINT
TOPICAL_OINTMENT | OPHTHALMIC | Status: AC
  Filled 2013-10-15: qty 3.5

## 2013-10-15 MED ORDER — ENOXAPARIN SODIUM 40 MG/0.4ML ~~LOC~~ SOLN
40.0000 mg | SUBCUTANEOUS | Status: DC
  Administered 2013-10-15: 40 mg via SUBCUTANEOUS
  Filled 2013-10-15 (×2): qty 0.4

## 2013-10-15 MED ORDER — INSULIN ASPART 100 UNIT/ML ~~LOC~~ SOLN
0.0000 [IU] | Freq: Three times a day (TID) | SUBCUTANEOUS | Status: DC
  Administered 2013-10-16 (×2): 4 [IU] via SUBCUTANEOUS
  Administered 2013-10-17: 3 [IU] via SUBCUTANEOUS

## 2013-10-15 MED ORDER — LIDOCAINE HCL (CARDIAC) 20 MG/ML IV SOLN
INTRAVENOUS | Status: AC
  Filled 2013-10-15: qty 5

## 2013-10-15 MED ORDER — POLYETHYLENE GLYCOL 3350 17 G PO PACK
17.0000 g | PACK | Freq: Every day | ORAL | Status: DC
  Administered 2013-10-15 – 2013-10-17 (×2): 17 g via ORAL
  Filled 2013-10-15 (×3): qty 1

## 2013-10-15 MED ORDER — LACTATED RINGERS IV BOLUS (SEPSIS): 250.0000 mL | Freq: Once | INTRAVENOUS | Status: DC

## 2013-10-15 MED ORDER — FENTANYL CITRATE 0.05 MG/ML IJ SOLN
50.0000 ug | Freq: Once | INTRAMUSCULAR | Status: AC
  Administered 2013-10-15: 50 ug via INTRAVENOUS

## 2013-10-15 MED ORDER — LIDOCAINE HCL (CARDIAC) 20 MG/ML IV SOLN
INTRAVENOUS | Status: DC | PRN
  Administered 2013-10-15: 100 mg via INTRAVENOUS

## 2013-10-15 MED ORDER — LACTATED RINGERS IV BOLUS (SEPSIS)
250.0000 mL | Freq: Once | INTRAVENOUS | Status: DC
  Administered 2013-10-15: 250 mL via INTRAVENOUS

## 2013-10-15 MED ORDER — ONDANSETRON HCL 4 MG/2ML IJ SOLN: 4.0000 mg | Freq: Once | INTRAMUSCULAR | Status: DC | PRN

## 2013-10-15 MED ORDER — ASPIRIN EC 81 MG PO TBEC
81.0000 mg | DELAYED_RELEASE_TABLET | Freq: Every day | ORAL | Status: DC
  Administered 2013-10-15 – 2013-10-17 (×2): 81 mg via ORAL
  Filled 2013-10-15 (×3): qty 1

## 2013-10-15 MED ORDER — ROCURONIUM BROMIDE 50 MG/5ML IV SOLN
INTRAVENOUS | Status: AC
  Filled 2013-10-15: qty 1

## 2013-10-15 MED ORDER — HEPARIN SODIUM (PORCINE) 1000 UNIT/ML IJ SOLN
INTRAMUSCULAR | Status: DC | PRN
  Administered 2013-10-15: 5000 [IU] via INTRAVENOUS

## 2013-10-15 MED ORDER — PHENYLEPHRINE HCL 10 MG/ML IJ SOLN
INTRAMUSCULAR | Status: DC | PRN
  Administered 2013-10-15: 280 ug via INTRAVENOUS
  Administered 2013-10-15: 160 ug via INTRAVENOUS
  Administered 2013-10-15: 120 ug via INTRAVENOUS
  Administered 2013-10-15: 200 ug via INTRAVENOUS
  Administered 2013-10-15 (×3): 120 ug via INTRAVENOUS

## 2013-10-15 MED ORDER — GLYCOPYRROLATE 0.2 MG/ML IJ SOLN
INTRAMUSCULAR | Status: DC | PRN
  Administered 2013-10-15 (×2): 0.2 mg via INTRAVENOUS

## 2013-10-15 MED ORDER — TETANUS-DIPHTH-ACELL PERTUSSIS 5-2.5-18.5 LF-MCG/0.5 IM SUSP
0.5000 mL | Freq: Once | INTRAMUSCULAR | Status: AC
  Administered 2013-10-15: 0.5 mL via INTRAMUSCULAR
  Filled 2013-10-15: qty 0.5

## 2013-10-15 MED ORDER — EPHEDRINE SULFATE 50 MG/ML IJ SOLN
INTRAMUSCULAR | Status: AC
  Filled 2013-10-15: qty 1

## 2013-10-15 MED ORDER — EPHEDRINE SULFATE 50 MG/ML IJ SOLN
INTRAMUSCULAR | Status: DC | PRN
  Administered 2013-10-15: 15 mg via INTRAVENOUS
  Administered 2013-10-15 (×3): 10 mg via INTRAVENOUS
  Administered 2013-10-15: 15 mg via INTRAVENOUS
  Administered 2013-10-15: 5 mg via INTRAVENOUS
  Administered 2013-10-15: 10 mg via INTRAVENOUS

## 2013-10-15 MED ORDER — SODIUM CHLORIDE 0.9 % IV BOLUS (SEPSIS)
1000.0000 mL | Freq: Once | INTRAVENOUS | Status: AC
  Administered 2013-10-15: 1000 mL via INTRAVENOUS

## 2013-10-15 MED ORDER — SODIUM CHLORIDE 0.9 % IV BOLUS (SEPSIS)
500.0000 mL | Freq: Once | INTRAVENOUS | Status: AC
  Administered 2013-10-15: 500 mL via INTRAVENOUS

## 2013-10-15 MED ORDER — PROPOFOL 10 MG/ML IV BOLUS
INTRAVENOUS | Status: AC
  Filled 2013-10-15: qty 20

## 2013-10-15 MED ORDER — DOCUSATE SODIUM 100 MG PO CAPS
100.0000 mg | ORAL_CAPSULE | Freq: Two times a day (BID) | ORAL | Status: DC
  Administered 2013-10-15 – 2013-10-17 (×4): 100 mg via ORAL
  Filled 2013-10-15 (×4): qty 1

## 2013-10-15 MED ORDER — FENTANYL CITRATE 0.05 MG/ML IJ SOLN
INTRAMUSCULAR | Status: AC
  Filled 2013-10-15: qty 5

## 2013-10-15 MED ORDER — 0.9 % SODIUM CHLORIDE (POUR BTL) OPTIME
TOPICAL | Status: DC | PRN
  Administered 2013-10-15: 1000 mL

## 2013-10-15 MED ORDER — ARTIFICIAL TEARS OP OINT
TOPICAL_OINTMENT | OPHTHALMIC | Status: DC | PRN
  Administered 2013-10-15: 1 via OPHTHALMIC

## 2013-10-15 MED ORDER — PHENYLEPHRINE HCL 10 MG/ML IJ SOLN
10.0000 mg | INTRAMUSCULAR | Status: DC | PRN
  Administered 2013-10-15: 100 ug/min via INTRAVENOUS

## 2013-10-15 SURGICAL SUPPLY — 76 items
BANDAGE ELASTIC 4 VELCRO ST LF (GAUZE/BANDAGES/DRESSINGS) ×3 IMPLANT
BANDAGE ESMARK 6X9 LF (GAUZE/BANDAGES/DRESSINGS) ×2 IMPLANT
BANDAGE GAUZE ELAST BULKY 4 IN (GAUZE/BANDAGES/DRESSINGS) ×3 IMPLANT
BLADE SURG 10 STRL SS (BLADE) ×3 IMPLANT
BNDG COHESIVE 4X5 TAN STRL (GAUZE/BANDAGES/DRESSINGS) ×3 IMPLANT
BNDG ESMARK 6X9 LF (GAUZE/BANDAGES/DRESSINGS) ×3
BNDG GAUZE ELAST 4 BULKY (GAUZE/BANDAGES/DRESSINGS) ×3 IMPLANT
CLIP TI MEDIUM 24 (CLIP) ×6 IMPLANT
COVER SURGICAL LIGHT HANDLE (MISCELLANEOUS) ×3 IMPLANT
COVER TABLE BACK 60X90 (DRAPES) ×3 IMPLANT
DRAPE IMP U-DRAPE 54X76 (DRAPES) ×9 IMPLANT
DRAPE OEC MINIVIEW 54X84 (DRAPES) ×3 IMPLANT
DRAPE U-SHAPE 47X51 STRL (DRAPES) ×3 IMPLANT
DRSG ADAPTIC 3X8 NADH LF (GAUZE/BANDAGES/DRESSINGS) ×3 IMPLANT
DURAPREP 26ML APPLICATOR (WOUND CARE) ×9 IMPLANT
ELECT REM PT RETURN 9FT ADLT (ELECTROSURGICAL) ×3
ELECTRODE REM PT RTRN 9FT ADLT (ELECTROSURGICAL) ×2 IMPLANT
FORCEPS HORIZONTAL 25G DISP (OPHTHALMIC RELATED) ×3 IMPLANT
GAUZE XEROFORM 1X8 LF (GAUZE/BANDAGES/DRESSINGS) ×3 IMPLANT
GLOVE BIO SURGEON STRL SZ7 (GLOVE) ×3 IMPLANT
GLOVE BIO SURGEON STRL SZ7.5 (GLOVE) ×3 IMPLANT
GLOVE BIO SURGEON STRL SZ8 (GLOVE) ×9 IMPLANT
GLOVE BIOGEL M SZ8.5 STRL (GLOVE) ×6 IMPLANT
GLOVE BIOGEL PI IND STRL 6.5 (GLOVE) ×2 IMPLANT
GLOVE BIOGEL PI IND STRL 7.0 (GLOVE) ×6 IMPLANT
GLOVE BIOGEL PI IND STRL 7.5 (GLOVE) ×2 IMPLANT
GLOVE BIOGEL PI IND STRL 8 (GLOVE) ×6 IMPLANT
GLOVE BIOGEL PI INDICATOR 6.5 (GLOVE) ×1
GLOVE BIOGEL PI INDICATOR 7.0 (GLOVE) ×3
GLOVE BIOGEL PI INDICATOR 7.5 (GLOVE) ×1
GLOVE BIOGEL PI INDICATOR 8 (GLOVE) ×3
GLOVE BIOGEL PI ORTHO PRO SZ8 (GLOVE) ×1
GLOVE PI ORTHO PRO STRL SZ8 (GLOVE) ×2 IMPLANT
GLOVE SURG ORTHO 8.0 STRL STRW (GLOVE) ×3 IMPLANT
GLOVE SURG SS PI 7.0 STRL IVOR (GLOVE) ×9 IMPLANT
GLOVE SURG SS PI 7.5 STRL IVOR (GLOVE) ×3 IMPLANT
GOWN STRL REUS W/ TWL LRG LVL3 (GOWN DISPOSABLE) ×6 IMPLANT
GOWN STRL REUS W/ TWL XL LVL3 (GOWN DISPOSABLE) ×2 IMPLANT
GOWN STRL REUS W/TWL 2XL LVL3 (GOWN DISPOSABLE) ×3 IMPLANT
GOWN STRL REUS W/TWL LRG LVL3 (GOWN DISPOSABLE) ×3
GOWN STRL REUS W/TWL XL LVL3 (GOWN DISPOSABLE) ×1
KIT BASIN OR (CUSTOM PROCEDURE TRAY) ×3 IMPLANT
KIT ROOM TURNOVER OR (KITS) ×3 IMPLANT
LOOP VESSEL MINI RED (MISCELLANEOUS) ×3 IMPLANT
NS IRRIG 1000ML POUR BTL (IV SOLUTION) ×3 IMPLANT
PACK ORTHO EXTREMITY (CUSTOM PROCEDURE TRAY) ×3 IMPLANT
PAD ABD 8X10 STRL (GAUZE/BANDAGES/DRESSINGS) ×6 IMPLANT
PAD ARMBOARD 7.5X6 YLW CONV (MISCELLANEOUS) ×6 IMPLANT
PAD CAST 4YDX4 CTTN HI CHSV (CAST SUPPLIES) ×4 IMPLANT
PADDING CAST ABS 4INX4YD NS (CAST SUPPLIES) ×2
PADDING CAST ABS COTTON 4X4 ST (CAST SUPPLIES) ×4 IMPLANT
PADDING CAST COTTON 4X4 STRL (CAST SUPPLIES) ×2
PENCIL BUTTON HOLSTER BLD 10FT (ELECTRODE) ×3 IMPLANT
SPLINT PLASTER CAST XFAST 5X30 (CAST SUPPLIES) ×2 IMPLANT
SPLINT PLASTER XFAST SET 5X30 (CAST SUPPLIES) ×1
SPONGE GAUZE 4X4 12PLY (GAUZE/BANDAGES/DRESSINGS) ×3 IMPLANT
SPONGE GAUZE 4X4 12PLY STER LF (GAUZE/BANDAGES/DRESSINGS) ×3 IMPLANT
SPONGE LAP 18X18 X RAY DECT (DISPOSABLE) ×12 IMPLANT
STOCKINETTE IMPERVIOUS 9X36 MD (GAUZE/BANDAGES/DRESSINGS) ×9 IMPLANT
SUT ETHILON 2 0 FS 18 (SUTURE) ×3 IMPLANT
SUT ETHILON 2 0 PSLX (SUTURE) ×6 IMPLANT
SUT PROLENE 6 0 BV (SUTURE) ×6 IMPLANT
SUT SILK 2 0 (SUTURE) ×1
SUT SILK 2-0 18XBRD TIE 12 (SUTURE) ×2 IMPLANT
SUT SILK 3 0 (SUTURE) ×1
SUT SILK 3-0 18XBRD TIE 12 (SUTURE) ×2 IMPLANT
SUT VIC AB 2-0 CT1 27 (SUTURE) ×1
SUT VIC AB 2-0 CT1 TAPERPNT 27 (SUTURE) ×2 IMPLANT
SUT VIC AB 2-0 CTX 27 (SUTURE) ×3 IMPLANT
SYR 20CC LL (SYRINGE) ×3 IMPLANT
TOWEL OR 17X24 6PK STRL BLUE (TOWEL DISPOSABLE) ×3 IMPLANT
TOWEL OR 17X26 10 PK STRL BLUE (TOWEL DISPOSABLE) ×3 IMPLANT
TOWEL OR NON WOVEN STRL DISP B (DISPOSABLE) ×3 IMPLANT
TUBE CONNECTING 12X1/4 (SUCTIONS) ×3 IMPLANT
UNDERPAD 30X30 INCONTINENT (UNDERPADS AND DIAPERS) ×3 IMPLANT
YANKAUER SUCT BULB TIP NO VENT (SUCTIONS) ×3 IMPLANT

## 2013-10-15 NOTE — Op Note (Addendum)
10/15/2013  1:44 PM  PATIENT:  Joshua Nielsen    PRE-OPERATIVE DIAGNOSIS:  foreign body left upper arm  POST-OPERATIVE DIAGNOSIS:  Same  PROCEDURE:  IRRIGATION AND DEBRIDEMENT LEFT UPPER ARM, REMOVAL FOREIGN BODY  SURGEON:  Sheral Apleyimothy D Murphy, MD  ASSISTANT: Janace LittenBrandon Parry OPA  ANESTHESIA:   Gen  PREOPERATIVE INDICATIONS:  Joshua Nielsen is a  58 y.o. male with a diagnosis of foreign body left upper arm who failed conservative measures and elected for surgical management.    The risks benefits and alternatives were discussed with the patient preoperatively including but not limited to the risks of infection, bleeding, nerve injury, cardiopulmonary complications, the need for revision surgery, among others, and the patient was willing to proceed.  OPERATIVE IMPLANTS: none  OPERATIVE FINDINGS: 8cm Metal foreign body came out whole, no arterial bleeding, palpable pulses distally  BLOOD LOSS: min  COMPLICATIONS: none  TOURNIQUET TIME: 30  Prior the procedure discussed the case with Dr. York PellantBrabahm and he felt that there was unlikely to be a large arterial injury on the arteriogram elected to go forward with removal of this foreign body I discussed the options with his the patient and his wife sometimes redo loose leave metal foreign bodies in however there is a high chance that this may be contaminated and its close proximity to the neurovascular structures could cause significant problems in the future  OPERATIVE PROCEDURE:  Patient was identified in the preoperative holding area and site was marked by me He was transported to the operating theater and placed on the table in supine position taking care to pad all bony prominences. After a preincinduction time out anesthesia was induced. The left upper extremity was prepped and draped in normal sterile fashion and a pre-incision timeout was performed. He received ancef for preoperative antibiotics.   I placed a sterile tourniquet over the left arm  and inflated after elevating the extremity. I then explored both wounds and did not see any arterial bleeding or any gross contamination. I incised the skin between the 2 wounds connecting them to allow for proper exploration and visualization of the foreign body. I did not not want to remove it percutaneously as its was very close to his brachial artery. Once I had direct visualization of it I was able to remove easily they did come out hole I confirmed this with an x-ray. I then explored the wound I dropped the tourniquet and there is no arterial bleeding. I did release the fascia between the 2 wounds there is no increase compartment pressure in his compartments were soft. Distally he had palpable radial and ulnar pulses. I then thoroughly irrigated the wound with 2 L of saline and reapproximated the skin leaving it loose closure in case it needed to drain. I placed a sterile dressing and a long-arm splint.  At this point it was noted that he had significant saturation of his splint so I elected to explore his wound. I asked Dr. Nicholaus BloomBrabrahm's to explore his vascular structures and he agreed. I re-prepped and draped the Left upper extremity. I removed all stitches and he had a doppler-able radial but not ulnar pulse at this time. He had formed a significant hematoma at his wound but still no obvious arterial bleeding. I held pressure and turned over the case to dr. Myra GianottiBrabham at this point. Please see his note for the remainder of the case.   POST OPERATIVE PLAN: will defer to vascular but from my standpoint no chemical dvt px is  indicated (scd's and ambulation would be my plan). Will do post op ancef.

## 2013-10-15 NOTE — Progress Notes (Signed)
At 2100 hrs while on rounds I was asked by Harriett Sine, RN to see pt.   Pt is s/p left brachial artery repair today.  Alert, oriented with c/o numbness in left UE. Left hand edematous, palpable radial pulse, able to wiggle fingers of left hand, sensation intact.  VS at 2030:   88/50   87HR reg  15 94% O2 Sat on RA  98.5 oral temp. Harriett Sine spoke with Dr Janee Morn who ordered a 500 cc NS bolus.  She also informed MD of LUE numbness.  LUE elevated on pillows, ace wrap dressing intact.  Asymptomatic BP at this point.  Taking POs without nausea or vomiting.  Will continue to follow as needed.  Handoff report to [place], [place]

## 2013-10-15 NOTE — Transfer of Care (Signed)
Immediate Anesthesia Transfer of Care Note  Patient: Joshua Nielsen  Procedure(s) Performed: Procedure(s): WOUND EXPLORATION - LEFT ARM OPERATIVE SITE (Left) IRRIGATION AND DEBRIDEMENT EXTREMITY  (Left) PRIMARY BRACHIAL ARTERY REPAIR (Left)  Patient Location: PACU  Anesthesia Type:General  Level of Consciousness: awake, alert  and oriented  Airway & Oxygen Therapy: Patient Spontanous Breathing and Patient connected to nasal cannula oxygen  Post-op Assessment: Report given to PACU RN, Post -op Vital signs reviewed and stable and Patient moving all extremities  Post vital signs: Reviewed and stable  Complications: No apparent anesthesia complications

## 2013-10-15 NOTE — Progress Notes (Signed)
Right hab Nd/fingers noted to be puffy. Warm to touch, cap refill < 3 sec, 2+ r rad pulse. Excellent blood return from right wrist PIV. RUE elev on 2 pillows. Pt has no c/o, but told to notify RN if any discomfort occurs.Marland Kitchen

## 2013-10-15 NOTE — H&P (Signed)
Chief Complaint: "Left arm injury at work." Referring Physician: Dr. Grandville Silos HPI: Joshua Nielsen is an 58 y.o. male who was at work today and was struck in the left arm by a piece of metal with significant blood loss. He admits to left hand numbness. Patient presented to [facility] trauma center and IR received request for image guided left upper extremity arteriogram today with vascular surgery to follow. The patient is currently alert and oriented and his wife is in the room. He admits to sleep apnea and wear a CPAP. He denies any chest pain or shortness of breath. He denies any known allergy to iodinated contrast or known kidney disease. He has previously tolerated sedation without complications.   Past Medical History:  Past Medical History  Diagnosis Date  . Hypertension   . Diabetes mellitus without complication   . Thyroid disease     Past Surgical History:  Past Surgical History  Procedure Laterality Date  . Cholecystectomy    . Back surgery    . Hernia repair      umbilical    Family History: No family history on file.  Social History:  reports that he has never smoked. He does not have any smokeless tobacco history on file. He reports that he does not drink alcohol or use illicit drugs.  Allergies:  Allergies  Allergen Reactions  . Levaquin [Levofloxacin] Other (See Comments)    Severe joint pain  . Demerol [Meperidine] Itching and Rash    Medications:   Medication List    ASK your doctor about these medications       amLODipine-benazepril 5-10 MG per capsule  Commonly known as:  LOTREL  Take 1 capsule by mouth daily.     levothyroxine 50 MCG tablet  Commonly known as:  SYNTHROID, LEVOTHROID  Take 50 mcg by mouth daily before breakfast.     metFORMIN 500 MG tablet  Commonly known as:  GLUCOPHAGE  Take 500 mg by mouth daily with breakfast.     metoprolol tartrate 25 MG tablet  Commonly known as:  LOPRESSOR  Take 25 mg by mouth daily.     valsartan-hydrochlorothiazide 320-25 MG per tablet  Commonly known as:  DIOVAN-HCT  Take 1 tablet by mouth daily.       Please HPI for pertinent positives, otherwise complete 10 system ROS negative.  Physical Exam: BP 116/69  Pulse 54  Temp(Src) 97.5 F (36.4 C) (Oral)  Resp 12  Ht 5' 10.45" (1.789 m)  Wt 245 lb (111.131 kg)  BMI 34.72 kg/m2  SpO2 97% Body mass index is 34.72 kg/(m^2).  General Appearance:  Alert, cooperative, no distress  Head:  Normocephalic, without obvious abnormality, atraumatic  Neck: Supple, symmetrical, trachea midline  Lungs:   Clear to auscultation bilaterally, no w/r/r, respirations unlabored without use of accessory muscles.  Chest Wall:  No tenderness or deformity  Heart:  Regular rate and rhythm, S1, S2 normal, no murmur, rub or gallop.  Abdomen:   Soft, non-tender, non distended.  Extremities: LE Extremities normal, atraumatic, no cyanosis or edema, LUE with multiple lacerations and hematoma.   Pulses: DP 2+ and symmetric, LUE radial and ulnar not palpable  Neurologic: Normal affect, no gross deficits.   Results for orders placed during the hospital encounter of 10/15/13 (from the past 48 hour(s))  PREPARE FRESH FROZEN PLASMA     Status: None   Collection Time    10/15/13  8:39 AM      Result Value Ref Range  Unit Number [id number]     Blood Component Type THAWED PLASMA     Unit division 00     Status of Unit ISSUED     Unit tag comment VERBAL ORDERS PER DR GOLDSTON     Transfusion Status OK TO TRANSFUSE     Unit Number [id number]     Blood Component Type THAWED PLASMA     Unit division 00     Status of Unit ISSUED     Unit tag comment VERBAL ORDERS PER DR GOLDSTON     Transfusion Status OK TO TRANSFUSE    TYPE AND SCREEN     Status: None   Collection Time    10/15/13  9:00 AM      Result Value Ref Range   ABO/RH(D) A POS     Antibody Screen NEG     Sample Expiration 10/18/2013     Unit Number [id number]     Blood  Component Type RBC LR PHER1     Unit division 00     Status of Unit ISSUED     Unit tag comment VERBAL ORDERS PER DR GOLDSTON     Transfusion Status OK TO TRANSFUSE     Crossmatch Result COMPATIBLE     Unit Number [id number]     Blood Component Type RBC LR PHER1     Unit division 00     Status of Unit ISSUED     Unit tag comment VERBAL ORDERS PER DR GOLDSTON     Transfusion Status OK TO TRANSFUSE     Crossmatch Result COMPATIBLE    CDS SEROLOGY     Status: None   Collection Time    10/15/13  9:06 AM      Result Value Ref Range   CDS serology specimen       Value: SPECIMEN WILL BE HELD FOR 14 DAYS IF TESTING IS REQUIRED  COMPREHENSIVE METABOLIC PANEL     Status: Abnormal   Collection Time    10/15/13  9:06 AM      Result Value Ref Range   Sodium 142  137 - 147 mEq/L   Potassium 3.5 (*) 3.7 - 5.3 mEq/L   Chloride 99  96 - 112 mEq/L   CO2 25  19 - 32 mEq/L   Glucose, Bld 164 (*) 70 - 99 mg/dL   BUN 14  6 - 23 mg/dL   Creatinine, Ser 1.09  0.50 - 1.35 mg/dL   Calcium 9.7  8.4 - 10.5 mg/dL   Total Protein 7.1  6.0 - 8.3 g/dL   Albumin 3.5  3.5 - 5.2 g/dL   AST 25  0 - 37 U/L   ALT 25  0 - 53 U/L   Alkaline Phosphatase 61  39 - 117 U/L   Total Bilirubin 1.3 (*) 0.3 - 1.2 mg/dL   GFR calc non Af Amer 74 (*) >90 mL/min   GFR calc Af Amer 85 (*) >90 mL/min   Comment: (NOTE)     The eGFR has been calculated using the CKD EPI equation.     This calculation has not been validated in all clinical situations.     eGFR's persistently <90 mL/min signify possible Chronic Kidney     Disease.  CBC     Status: Abnormal   Collection Time    10/15/13  9:06 AM      Result Value Ref Range   WBC 10.3  4.0 - 10.5 K/uL   RBC 4.06 (*) 4.22 -  5.81 MIL/uL   Hemoglobin 12.7 (*) 13.0 - 17.0 g/dL   HCT 38.1 (*) 39.0 - 52.0 %   MCV 93.8  78.0 - 100.0 fL   MCH 31.3  26.0 - 34.0 pg   MCHC 33.3  30.0 - 36.0 g/dL   RDW 14.1  11.5 - 15.5 %   Platelets 317  150 - 400 K/uL  ETHANOL     Status:  None   Collection Time    10/15/13  9:06 AM      Result Value Ref Range   Alcohol, Ethyl (B) <11  0 - 11 mg/dL   Comment:            LOWEST DETECTABLE LIMIT FOR     SERUM ALCOHOL IS 11 mg/dL     FOR MEDICAL PURPOSES ONLY  PROTIME-INR     Status: None   Collection Time    10/15/13  9:06 AM      Result Value Ref Range   Prothrombin Time 13.8  11.6 - 15.2 seconds   INR 1.08  0.00 - 1.49   Dg Chest Port 1 View  10/15/2013   CLINICAL DATA:  trauma  EXAM: PORTABLE CHEST - 1 VIEW  COMPARISON:  None.  FINDINGS: Low lung volumes. No pneumothorax. The heart size and mediastinal contours are within normal limits. Both lungs are clear. The visualized skeletal structures are unremarkable.  IMPRESSION: No active disease. No radiographic evidence of posttraumatic abnormalities.   Electronically Signed   By: Margaree Mackintosh M.D.   On: 10/15/2013 09:25   Dg Humerus Left  10/15/2013   CLINICAL DATA:  Laceration, metallic foreign body  EXAM: LEFT HUMERUS - 2+ VIEW  COMPARISON:  None.  FINDINGS: No acute fracture or dislocation. Metallic foreign body in the soft tissues medial to the mid left humeral diaphysis with overlying soft tissue laceration. The metallic foreign body measures 1.1 x 5.5 cm.  IMPRESSION: Metallic foreign body in the soft tissues medial to the mid left humeral diaphysis with overlying soft tissue laceration.   Electronically Signed   By: Kathreen Devoid   On: 10/15/2013 09:19    Assessment/Plan Left upper extremity injury, suspect brachial arterial injury. IR received request for left upper extremity arteriogram pre-operatively Patient last ate breakfast at 0630, injury occurred at 0730, no known allergies to iodinated contrast or known kidney disease and patient is on metformin will need to hold post arteriogram 48 hours.  Risks and Benefits discussed with the patient. All of the patient's questions were answered, patient is agreeable to proceed. Consent signed and in chart.   Hedy Jacob PA-C 10/15/2013, 10:08 AM

## 2013-10-15 NOTE — ED Notes (Signed)
Awaiting call from IR

## 2013-10-15 NOTE — Progress Notes (Signed)
Notified Dr.  Janee Morn of blood pressures 88/50 and pt c/o left hand numbness, new order received

## 2013-10-15 NOTE — H&P (Signed)
Joshua Nielsen is an 58 y.o. male.   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 Significant blood loss at the scene. He came in as a level 1 trauma. No LOC. C/O numbness L hand. He denies other injury. Feels like he needs to have a BM.   Past Medical History  Diagnosis Date  . Hypertension   . Diabetes mellitus without complication   . Thyroid disease     Past Surgical History  Procedure Laterality Date  . Cholecystectomy    . Back surgery    . Hernia repair      umbilical    No family history on file. Social History:  reports that he has never smoked. He does not have any smokeless tobacco history on file. He reports that he does not drink alcohol or use illicit drugs.  Allergies:  Allergies  Allergen Reactions  . Demerol [Meperidine]   . Levaquin [Levofloxacin In D5w]      (Not in a hospital admission)  Results for orders placed during the hospital encounter of 10/15/13 (from the past 48 hour(s))  TYPE AND SCREEN     Status: None   Collection Time    10/15/13  8:39 AM      Result Value Ref Range   ABO/RH(D) PENDING     Antibody Screen PENDING     Sample Expiration 10/18/2013     Unit Number [id number]     Blood Component Type RBC LR PHER1     Unit division 00     Status of Unit ISSUED     Unit tag comment VERBAL ORDERS PER DR GOLDSTON     Transfusion Status OK TO TRANSFUSE     Crossmatch Result PENDING     Unit Number [id number]     Blood Component Type RBC LR PHER1     Unit division 00     Status of Unit ISSUED     Unit tag comment VERBAL ORDERS PER DR GOLDSTON     Transfusion Status OK TO TRANSFUSE     Crossmatch Result PENDING    PREPARE FRESH FROZEN PLASMA     Status: None   Collection Time    10/15/13  8:39 AM      Result Value Ref Range   Unit Number [id number]     Blood Component Type THAWED PLASMA     Unit division 00     Status of Unit ISSUED     Unit tag comment VERBAL ORDERS PER DR GOLDSTON     Transfusion Status OK TO TRANSFUSE     Unit Number [id number]     Blood Component Type THAWED PLASMA     Unit division 00     Status of Unit ISSUED     Unit tag comment VERBAL ORDERS PER DR GOLDSTON     Transfusion Status OK TO TRANSFUSE    CBC     Status: Abnormal   Collection Time    10/15/13  9:06 AM      Result Value Ref Range   WBC 10.3  4.0 - 10.5 K/uL   RBC 4.06 (*) 4.22 - 5.81 MIL/uL   Hemoglobin 12.7 (*) 13.0 - 17.0 g/dL   HCT 58.3 (*) 09.4 - 07.6 %   MCV 93.8  78.0 - 100.0 fL   MCH 31.3  26.0 - 34.0 pg   MCHC 33.3  30.0 - 36.0  g/dL   RDW 45.414.1  09.811.5 - 11.915.5 %   Platelets 317  150 - 400 K/uL   Dg Chest Port 1 View  10/15/2013   CLINICAL DATA:  trauma  EXAM: PORTABLE CHEST - 1 VIEW  COMPARISON:  None.  FINDINGS: Low lung volumes. No pneumothorax. The heart size and mediastinal contours are within normal limits. Both lungs are clear. The visualized skeletal structures are unremarkable.  IMPRESSION: No active disease. No radiographic evidence of posttraumatic abnormalities.   Electronically Signed   By: Salome HolmesHector  Cooper M.D.   On: 10/15/2013 09:25   Dg Humerus Left  10/15/2013   CLINICAL DATA:  Laceration, metallic foreign body  EXAM: LEFT HUMERUS - 2+ VIEW  COMPARISON:  None.  FINDINGS: No acute fracture or dislocation. Metallic foreign body in the soft tissues medial to the mid left humeral diaphysis with overlying soft tissue laceration. The metallic foreign body measures 1.1 x 5.5 cm.  IMPRESSION: Metallic foreign body in the soft tissues medial to the mid left humeral diaphysis with overlying soft tissue laceration.   Electronically Signed   By: Elige KoHetal  Patel   On: 10/15/2013 09:19    Review of Systems  Constitutional: Negative for fever and chills.  HENT: Negative.   Eyes: Negative.   Respiratory: Negative for cough and shortness of breath.   Cardiovascular: Negative for chest pain and palpitations.  Gastrointestinal:        See HPI  Genitourinary: Negative.   Musculoskeletal:       Pain L upper arm  Skin:       See HPI  Neurological:       Numbness L hand  Endo/Heme/Allergies: Negative.   Psychiatric/Behavioral: Negative.     Blood pressure 91/58, pulse 58, temperature 97.8 F (36.6 C), resp. rate 13, SpO2 100.00%. Physical Exam  Constitutional: He is oriented to person, place, and time. He appears well-developed and well-nourished. No distress.  HENT:  Head: Normocephalic and atraumatic.  Right Ear: Tympanic membrane, external ear and ear canal normal.  Left Ear: Tympanic membrane, external ear and ear canal normal.  Mouth/Throat: Oropharynx is clear and moist. No oropharyngeal exudate.  Eyes: EOM are normal. Pupils are equal, round, and reactive to light. Right eye exhibits no discharge. Left eye exhibits no discharge. No scleral icterus.  Neck: Normal range of motion. Neck supple. No tracheal deviation present.  Cardiovascular:  RRR 50s  Respiratory: Effort normal and breath sounds normal. No stridor. No respiratory distress. He has no wheezes.  GI: Soft. He exhibits no distension. There is no tenderness. There is no rebound and no guarding.  Musculoskeletal:       Arms: 2cm jagged lac L arm with hematoma, ooze, 2 linear lacs below and one above, each 1cm, mild ooze  Neurological: He is alert and oriented to person, place, and time.  Moves L hand well but decreased LT sensation  Skin:  cool  Psychiatric: He has a normal mood and affect.     Assessment/Plan Industrial accident with laceration L arm and retained metal. Initial hemorrhagic shock better after IVF and 1U PRBC. We requested ortho consult and Dr. Eulah PontMurphy asked for CT angio and vascular surgery evaluation. He planned to take to the OR to remove the FB.  Dr. Myra GianottiBrabham from [facility] is a the bedside and is concerned about arterial injury. IR will do arteriogram and Dr. Myra GianottiBrabham plans to take to the OR. Admit to trauma post-op.  Liz MaladyBurke E  Takeshia Wenk 10/15/2013, 9:33 AM

## 2013-10-15 NOTE — Interval H&P Note (Signed)
History and Physical Interval Note:  10/15/2013 1:10 PM  Joshua Nielsen  has presented today for surgery, with the diagnosis of foreign body left upper arm  The various methods of treatment have been discussed with the patient and family. After consideration of risks, benefits and other options for treatment, the patient has consented to  Procedure(s): IRRIGATION AND DEBRIDEMENT LEFT UPPER ARM, REMOVAL FOREIGN BODY (Left) as a surgical intervention .  The patient's history has been reviewed, patient examined, no change in status, stable for surgery.  I have reviewed the patient's chart and labs.  Questions were answered to the patient's satisfaction.     Sheral Apley

## 2013-10-15 NOTE — H&P (View-Only) (Signed)
     ORTHOPAEDIC CONSULTATION  REQUESTING PHYSICIAN: Ephraim Hamburger, MD  Chief Complaint: Left arm foreign body   HPI: Joshua Nielsen is a 58 y.o. male who complains of an injury at work where a metal shard was driven into his Left arm.   Past Medical History  Diagnosis Date  . Hypertension   . Diabetes mellitus without complication   . Thyroid disease    Past Surgical History  Procedure Laterality Date  . Cholecystectomy    . Back surgery    . Hernia repair      umbilical   History   Social History  . Marital Status: N/A    Spouse Name: N/A    Number of Children: N/A  . Years of Education: N/A   Social History Main Topics  . Smoking status: Never Smoker   . Smokeless tobacco: None  . Alcohol Use: No  . Drug Use: No  . Sexual Activity: None   Other Topics Concern  . None   Social History Narrative  . None   No family history on file. Allergies  Allergen Reactions  . Demerol [Meperidine]   . Levaquin [Levofloxacin In D5w]    Prior to Admission medications   Not on File   No results found.  Positive ROS: All other systems have been reviewed and were otherwise negative with the exception of those mentioned in the HPI and as above.  Labs cbc No results found for this basename: WBC, HGB, HCT, PLT,  in the last 72 hours  Labs inflam No results found for this basename: ESR, CRP,  in the last 72 hours  Labs coag No results found for this basename: INR, PT, PTT,  in the last 72 hours  No results found for this basename: NA, K, CL, CO2, GLUCOSE, BUN, CREATININE, CALCIUM,  in the last 72 hours  Physical Exam: Filed Vitals:   10/15/13 0903  BP: 91/58  Pulse: 55  Temp: 97.8 F (36.6 C)  Resp: 13   General: Alert, no acute distress Cardiovascular: No pedal edema Respiratory: No cyanosis, no use of accessory musculature GI: No organomegaly, abdomen is soft and non-tender Skin: No lesions in the area of chief complaint Neurologic: Sensation intact  distally Psychiatric: Patient is competent for consent with normal mood and affect Lymphatic: No axillary or cervical lymphadenopathy  MUSCULOSKELETAL:  LUE: SILT M/R/U nerve with some mild parasthesias at finger tips but proximal sensation is intact, 2+ radial and ulnar pulse, +EPL/FPL/IO Compartments soft Painless ROM No Crepitous  Other extremities are atraumatic with painless ROM and NVI.  Assessment: Foreign body in medial arm near vascularture  Plan: I will remove the FB and irrigate/debride in the OR.  I have discussed with Dr. Trula Slade, he agrees there is no obvious injury to the artery on CT arteriogram, but he is available should I encounter an injury in the OR.  PT VTE px: SCD's and per trauma   Renette Butters, MD Cell [phone]   10/15/2013 9:04 AM

## 2013-10-15 NOTE — ED Notes (Signed)
PA Corrine Morgan at bedside.

## 2013-10-15 NOTE — Progress Notes (Signed)
Cuff BP on RUE 87/50. Pt has an old tendon injury to RUE and says his BP is usu.taken in LUE, also he took his 3 BP meds this am. Recheck of BP in RLE 97/50. SR in the 90's, pt awake and alert. Dr Ivin Booty updated fully. OK to tx pt to floor.

## 2013-10-15 NOTE — Progress Notes (Signed)
Responded to level 1 page to provide support to patient that reported to ED with Atrial Bleed to left arm after experiecing accident at work. I contacted pt's wife to inform her that her husband was here in ED but she had been told by her husband's employer and she  was in route to hospital. Escorted wife to consultation room B. and later O.R. Waiting area. Pt's going to O.R. Provided listening, ministry of presence, emotional and spiritual support to wife. Will follow as needed.  10/15/13 0900  Clinical Encounter Type  Visited With Family;Patient not available;Health care provider  Visit Type Initial;Spiritual support;Patient in surgery;ED;Trauma  Referral From Nurse  Spiritual Encounters  Spiritual Needs Emotional  Stress Factors  Patient Stress Factors None identified  Family Stress Factors Family relationships    10/15/13 0900  Clinical Encounter Type  Visited With Family;Patient not available;Health care provider  Visit Type Initial;Spiritual support;Patient in surgery;ED;Trauma  Referral From Nurse  Spiritual Encounters  Spiritual Needs Emotional  Stress Factors  Patient Stress Factors None identified  Family Stress Factors Family relationships  Mathis Dad, Chaplain,pager [phone]

## 2013-10-15 NOTE — ED Notes (Signed)
Ring, wallet [phone] and glasses given to wife.

## 2013-10-15 NOTE — Anesthesia Postprocedure Evaluation (Signed)
  Anesthesia Post-op Note  Patient: Joshua Nielsen  Procedure(s) Performed: Procedure(s): WOUND EXPLORATION - LEFT ARM OPERATIVE SITE (Left) IRRIGATION AND DEBRIDEMENT EXTREMITY  (Left) PRIMARY BRACHIAL ARTERY REPAIR (Left)  Patient Location: PACU  Anesthesia Type:General  Level of Consciousness: awake, alert  and oriented  Airway and Oxygen Therapy: Patient Spontanous Breathing  Post-op Pain: mild  Post-op Assessment: Post-op Vital signs reviewed  Post-op Vital Signs: Reviewed  Last Vitals:  Filed Vitals:   10/15/13 1814  BP: 87/50  Pulse: 90  Temp:   Resp: 14    Complications: No apparent anesthesia complications

## 2013-10-15 NOTE — Sedation Documentation (Signed)
Left arm radial pulse present +1. Left forearm cool and dark red in color

## 2013-10-15 NOTE — Anesthesia Preprocedure Evaluation (Addendum)
Anesthesia Evaluation  Patient identified by MRN, date of birth, ID band Patient awake    Reviewed: Allergy & Precautions, H&P , NPO status , Patient's Chart, lab work & pertinent test results  Airway Mallampati: I TM Distance: >3 FB Neck ROM: Full    Dental  (+) Edentulous Upper, Partial Lower, Dental Advisory Given   Pulmonary          Cardiovascular hypertension, Pt. on home beta blockers Rhythm:Regular Rate:Normal     Neuro/Psych    GI/Hepatic   Endo/Other  diabetes, Type 2, Oral Hypoglycemic Agents  Renal/GU      Musculoskeletal   Abdominal   Peds  Hematology   Anesthesia Other Findings   Reproductive/Obstetrics                         Anesthesia Physical Anesthesia Plan  ASA: III  Anesthesia Plan: General   Post-op Pain Management:    Induction: Intravenous  Airway Management Planned: Oral ETT  Additional Equipment:   Intra-op Plan:   Post-operative Plan: Extubation in OR  Informed Consent: I have reviewed the patients History and Physical, chart, labs and discussed the procedure including the risks, benefits and alternatives for the proposed anesthesia with the patient or authorized representative who has indicated his/her understanding and acceptance.     Plan Discussed with: CRNA and Surgeon  Anesthesia Plan Comments:         Anesthesia Quick Evaluation

## 2013-10-15 NOTE — Progress Notes (Signed)
84/55, HR 87 after 250cc bolus LR. Pt awake & alert, no c/o. Dr Michelle Piper updated. Order for additional 250 cc bolus. Will cont to monitor closely.

## 2013-10-15 NOTE — Progress Notes (Signed)
Pt placed on CPAP via FFM.  Current settings are 12 CMH2O, pt tolerating well at this time.  RT to monitor and assess as needed.

## 2013-10-15 NOTE — H&P (Signed)
Agree with PA note.  Risks including very low chance of arterial injury, stroke, allergic reaction and renal insufficiency, benefits and alternatives discussed with patient.  He understands and wishes to proceed.   Signed,  Sterling Big, MD [facility]

## 2013-10-15 NOTE — Procedures (Signed)
Interventional Radiology Procedure Note  Procedure: LUE arteriogram Access: Right common femoral artery, 58F sheath.  ExoSeal closure device Complications: None immediate Recommendations: - Bedrest leg straight x 2 hrs then may elevate HOB 30 deg next 2 hrs  Signed,  Sterling Big, MD [facility]

## 2013-10-15 NOTE — ED Notes (Addendum)
Dr Myra Gianotti (vascular) at bedside.

## 2013-10-15 NOTE — Consult Note (Signed)
     ORTHOPAEDIC CONSULTATION  REQUESTING PHYSICIAN: Ephraim Hamburger, MD  Chief Complaint: Left arm foreign body   HPI: Joshua Nielsen is a 58 y.o. male who complains of an injury at work where a metal shard was driven into his Left arm.   Past Medical History  Diagnosis Date  . Hypertension   . Diabetes mellitus without complication   . Thyroid disease    Past Surgical History  Procedure Laterality Date  . Cholecystectomy    . Back surgery    . Hernia repair      umbilical   History   Social History  . Marital Status: N/A    Spouse Name: N/A    Number of Children: N/A  . Years of Education: N/A   Social History Main Topics  . Smoking status: Never Smoker   . Smokeless tobacco: None  . Alcohol Use: No  . Drug Use: No  . Sexual Activity: None   Other Topics Concern  . None   Social History Narrative  . None   No family history on file. Allergies  Allergen Reactions  . Demerol [Meperidine]   . Levaquin [Levofloxacin In D5w]    Prior to Admission medications   Not on File   No results found.  Positive ROS: All other systems have been reviewed and were otherwise negative with the exception of those mentioned in the HPI and as above.  Labs cbc No results found for this basename: WBC, HGB, HCT, PLT,  in the last 72 hours  Labs inflam No results found for this basename: ESR, CRP,  in the last 72 hours  Labs coag No results found for this basename: INR, PT, PTT,  in the last 72 hours  No results found for this basename: NA, K, CL, CO2, GLUCOSE, BUN, CREATININE, CALCIUM,  in the last 72 hours  Physical Exam: Filed Vitals:   10/15/13 0903  BP: 91/58  Pulse: 55  Temp: 97.8 F (36.6 C)  Resp: 13   General: Alert, no acute distress Cardiovascular: No pedal edema Respiratory: No cyanosis, no use of accessory musculature GI: No organomegaly, abdomen is soft and non-tender Skin: No lesions in the area of chief complaint Neurologic: Sensation intact  distally Psychiatric: Patient is competent for consent with normal mood and affect Lymphatic: No axillary or cervical lymphadenopathy  MUSCULOSKELETAL:  LUE: SILT M/R/U nerve with some mild parasthesias at finger tips but proximal sensation is intact, 2+ radial and ulnar pulse, +EPL/FPL/IO Compartments soft Painless ROM No Crepitous  Other extremities are atraumatic with painless ROM and NVI.  Assessment: Foreign body in medial arm near vascularture  Plan: I will remove the FB and irrigate/debride in the OR.  I have discussed with Dr. Trula Slade, he agrees there is no obvious injury to the artery on CT arteriogram, but he is available should I encounter an injury in the OR.  PT VTE px: SCD's and per trauma   Renette Butters, MD Cell [phone]   10/15/2013 9:04 AM

## 2013-10-15 NOTE — Progress Notes (Signed)
SBP 80, SR around 90. Dr Michelle Piper notified. Order for fluid bolus. Will cont to monitor closely.

## 2013-10-15 NOTE — ED Notes (Signed)
Wife at bedside.  VS stable.

## 2013-10-15 NOTE — ED Notes (Signed)
Paint palpable radial pulse - heard with doppler.  Pt ao x 4.

## 2013-10-15 NOTE — Op Note (Signed)
Patient name: Rhae LernerCecil Spaid MRN: [id number] DOB: 06/27/1955 Sex: male  10/15/2013 Pre-operative Diagnosis: Left brachial artery injury from trauma Post-operative diagnosis:  Same Surgeon:  Nada LibmanVance W Brabham Assistants:  Doreatha MassedSamantha Rhyne Procedure:   Primary repair left brachial artery Anesthesia:  Gen. Blood Loss:  See anesthesia record Specimens:  None  Findings:  Approximately 90% transection of the brachial artery.  This was repaired primarily in a end to end fashion after trimming the edges of the proximal and distal portion of the artery for approximately 1 mm.  Indications:  The patient was involved in a entry today at work.  There was concern for brachial artery injury.  After removal of the foreign body, the patient developed more bleeding.  I therefore came in for further exploration.  Procedure:  The patient was identified in the holding area and [facility]  The patient was then placed supine on the table. general anesthesia was administered.  The patient was prepped and draped in the usual sterile fashion.  A time out was called and antibiotics were administered.  The patient had previously undergone removal of shrapnel by orthopedics.  After removal of the object, there was no significant bleeding.  Dressings were applied.  It was noted that the dressings became saturated.  The wound was then re-opened and there was more blood present.  I came in to explore the artery.  I used self-retaining retractors to get better exposure.  A after opening up the tissue around the artery it was clear that this was an arterial injury.  I tried to get control with manual compression for hemostasis, however this was unsuccessful.  Therefore, we placed a tourniquet on the upper arm and inflated to 250 mm of pressure.  I then was able to dissect out the brachial artery proximally and distally.  There was approximately a 90% transection of the artery.  It was held together posteriorly.  I felt that  I could perform a primary repair as this was a very sharp cuts.  I did this with a running 6-0 Prolene.  Prior to completion the tourniquet was let down and I flushed the artery appropriately.  I was not able to get a good Doppler signal at the wrist.  I further interrogated the repair and initially it had a Doppler signal but after about 5 minutes it had disappeared.  I therefore got more exposure proximally and distally on the artery and administered 5000 units of heparin.  After the heparin circulated I occluded the brachial artery.  I elected to transect approximately 1 mm of artery on either side repair.  There was a piece of plaque/intima that had occluded the artery.  This was removed.  There was excellent bleeding and antegrade and retrograde fashion.  I had mobilized the artery enough to perform primary repair without tension.  This was done with a running 6-0 Prolene.  Prior to completion, the appropriate flushing maneuvers were performed and the anastomosis was completed.  There was a good Doppler signal at the wrist after this.  I then inspected the wound for any additional vessel injury.  Several competing veins and brachial veins were occluded with metal clips.  The wound was then irrigated.  It appeared to be hemostatic.  I reapproximated some of the upper arm fascia with 2-0 Vicryl and placed several interrupted 3-0 nylon vertical mattress sutures to reapproximate the skin.  Sterile dressings were applied.   Disposition:  To PACU in stable  condition.   Juleen China, M.D. [facility] [place] Office: [phone] Pager:  [phone]

## 2013-10-15 NOTE — Consult Note (Signed)
[facility] Consult  Reason for Consult:  L arm injury Referring Physician:  ED MRN #:  [id number]  History of Present Illness: This is a 58 y.o. 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 He was struck in the arm by shards of metal. He came into the [facility] as a level 1 trauma. He complains of numbness of his left hand. Denies loss of consciousness. There was significant blood loss at the scene.   The patient has a history of diabetes without complication.  He is also medically managed for hypertension.  He does not take antiplatelet, specifically and aspirin, as this makes him bruise easily.  He is a nonsmoker.  Past Medical History  Diagnosis Date  . Hypertension   . Diabetes mellitus without complication   . Thyroid disease    Past Surgical History  Procedure Laterality Date  . Cholecystectomy    . Back surgery    . Hernia repair      umbilical    Allergies  Allergen Reactions  . Demerol [Meperidine]   . Levaquin [Levofloxacin In D5w]     Prior to Admission medications   Not on File    History   Social History  . Marital Status: Married    Spouse Name: N/A    Number of Children: N/A  . Years of Education: N/A   Occupational History  . Not on file.   Social History Main Topics  . Smoking status: Never Smoker   . Smokeless tobacco: Not on file  . Alcohol Use: No  . Drug Use: No  . Sexual Activity: Not on file   Other Topics Concern  . Not on file   Social History Narrative  . No narrative on file     No family history on file.  ROS: [x]  Positive   [ ]  Negative   [ ]  All sytems reviewed and are negative  Cardiovascular: []  chest pain/pressure []  palpitations []  SOB lying flat []  DOE []  pain in legs while walking []  pain in legs at rest []  pain in legs at night []  non-healing ulcers []  hx of DVT []  swelling in legs  Pulmonary: []  productive cough []   asthma/wheezing []  home O2  Neurologic: []  weakness in []  arms []  legs []  numbness in []  arms []  legs []  hx of CVA []  mini stroke [] difficulty speaking or slurred speech []  temporary loss of vision in one eye []  dizziness  Hematologic: []  hx of cancer []  bleeding problems []  problems with blood clotting easily  Endocrine:   []  diabetes []  thyroid disease  GI []  vomiting blood []  blood in stool  GU: []  CKD/renal failure []  HD--[]  M/W/F or []  T/T/S []  burning with urination []  blood in urine  Psychiatric: []  anxiety []  depression  Musculoskeletal: []  arthritis []  joint pain  Integumentary: []  rashes []  ulcers  Constitutional: []  fever []  chills   Physical Examination  Filed Vitals:   10/15/13 0934  BP: 116/70  Pulse: 57  Temp:   Resp: 13   Body mass index is 34.72 kg/(m^2).  General:  WDWN in NAD Gait: Not observed HENT: WNL, normocephalic Eyes: Pupils equal Pulmonary: normal non-labored breathing Cardiac: regular, without  Murmurs, rubs or gallops Abdomen: soft, NT/ND, no masses Skin: 3 lacerations to left upper arm. 2 linear lacerations approximately 1 cm. One 2 cm jagged lacerations with hematoma.  Vascular Exam/Pulses: Multiphasic left radial doppler signal, monophasic brachial  doppler signal   Right Left  Radial 2+ (normal) Not palpable  Ulnar 2+ (normal) Not palpable  DP 2+ (normal) 2+ (normal)  PT 2+ (normal) 2+ (normal)   Extremities: without ischemic changes, without Gangrene , without cellulitis; without open wounds;  Musculoskeletal: no muscle wasting or atrophy  Neurologic: A&O X 3; Numbness to left hand. MOTOR FUNCTION:  Weak grip strength of left hand. Able to move right hand. Left hand motor and neuro sensation intact.    CBC    Component Value Date/Time   WBC 10.3 10/15/2013 0906   RBC 4.06* 10/15/2013 0906   HGB 12.7* 10/15/2013 0906   HCT 38.1* 10/15/2013 0906   PLT 317 10/15/2013 0906   MCV 93.8 10/15/2013 0906   MCH 31.3 10/15/2013  0906   MCHC 33.3 10/15/2013 0906   RDW 14.1 10/15/2013 0906    BMET No results found for this basename: na, k, cl, co2, glucose, bun, creatinine, calcium, gfrnonaa, gfraa    COAGS: No results found for this basename: INR, PROTIME    ASSESSMENT: This is a 58 y.o. male with L arm injury.  PLAN: -IR for diagnostic arteriogram of left upper extremity. Suspect left brachial artery injury.  -Dr. Myra GianottiBrabham will take to OR following arteriogram results. -Dr. Eulah PontMurphy consulted  -Trauma will admit.    Maris BergerKimberly Trinh, PA-C [facility] Office: [phone] Pager: [phone]   I agree with the above.  The patient had a work-related injury today.  He has shrapnel still in his left arm as seen on x-ray.  On physical exam, he has a adequate Doppler signal in his left radial artery but monophasic signal in the ulnar and brachial.  He is also complaining of numbness in his hand but has good grip strength.  I suspect the patient has a high takeoff of his radial artery and that is why we are getting good signals in the radial but not the ulnar artery.  Most likely there is an injury to the brachial/ulnar artery.  I will better evaluate this with angiography.  There is an arterial injury he will require operative repair.  Durene CalWells Kolyn Rozario

## 2013-10-15 NOTE — OR Nursing (Signed)
1418 - Op site with noted bleeding - Surgeon and PA in room - removed dressings, pressure applied to site, and now awaiting arrival of Dr. Myra Gianotti for vascular consult.

## 2013-10-15 NOTE — Anesthesia Procedure Notes (Signed)
Procedure Name: Intubation Date/Time: 10/15/2013 1:05 PM Performed by: Jefm Miles E Pre-anesthesia Checklist: Patient identified, Emergency Drugs available, Suction available, Patient being monitored and Timeout performed Patient Re-evaluated:Patient Re-evaluated prior to inductionOxygen Delivery Method: Circle system utilized Preoxygenation: Pre-oxygenation with 100% oxygen Intubation Type: IV induction, Rapid sequence and Cricoid Pressure applied Laryngoscope Size: Mac and 3 Grade View: Grade I Tube type: Oral Tube size: 7.5 mm Number of attempts: 1 Airway Equipment and Method: Stylet Placement Confirmation: ETT inserted through vocal cords under direct vision,  positive ETCO2 and breath sounds checked- equal and bilateral Secured at: 23 cm Tube secured with: Tape Dental Injury: Teeth and Oropharynx as per pre-operative assessment

## 2013-10-15 NOTE — ED Provider Notes (Signed)
CSN: [id number]     Arrival date & time 10/15/13  0847 History   First MD Initiated Contact with Patient 10/15/13 0901     No chief complaint on file.    (Consider location/radiation/quality/duration/timing/severity/associated sxs/prior Treatment) HPI 58 year old male presents as a level I trauma after having a piece of metal go into his left upper arm. He was at work in a patient machinery broke off and metal went into his left arm. There was profuse bleeding per EMS. He states that his left hand is numb and tingly at this time. He is able to move it. The bleeding was controlled by EMS after pressure. They had a blood pressure of 88 systolic and gave him about 500 cc of fluid. No other injuries. Patient initially described abdominal pain but states he felt like he had to have a bowel movement earlier and feels this pain frequently. No trauma to his abdomen.  Past Medical History  Diagnosis Date  . Hypertension   . Diabetes mellitus without complication    Past Surgical History  Procedure Laterality Date  . Cholecystectomy    . Back surgery    . Hernia repair      umbilical   No family history on file. History  Substance Use Topics  . Smoking status: Not on file  . Smokeless tobacco: Not on file  . Alcohol Use: Not on file    Review of Systems  Unable to perform ROS: Acuity of condition      Allergies  Demerol and Levaquin  Home Medications   Prior to Admission medications   Not on File   BP 82/60 Physical Exam  Nursing note and vitals reviewed. Constitutional: He is oriented to person, place, and time. He appears well-developed and well-nourished.  HENT:  Head: Normocephalic and atraumatic.  Right Ear: External ear normal.  Left Ear: External ear normal.  Nose: Nose normal.  Eyes: Right eye exhibits no discharge. Left eye exhibits no discharge.  Neck: Neck supple.  Cardiovascular: Normal rate, regular rhythm, normal heart sounds and intact distal pulses.     Pulses:      Radial pulses are 1+ on the right side, and 0 on the left side.  Pulmonary/Chest: Effort normal and breath sounds normal.  Abdominal: Soft. He exhibits no distension. There is no tenderness.  Musculoskeletal: He exhibits no edema.       Left upper arm: He exhibits laceration.       Arms: Patient has normal ROM of left hand and fingers. Has decreased sensation and capillary refill. Unable to palpate left radial pulse but able to doppler.  Neurological: He is alert and oriented to person, place, and time.  Skin: Skin is warm and dry.    ED Course  Procedures (including critical care time) Labs Review Labs Reviewed  COMPREHENSIVE METABOLIC PANEL - Abnormal; Notable for the following:    Potassium 3.5 (*)    Glucose, Bld 164 (*)    Total Bilirubin 1.3 (*)    GFR calc non Af Amer 74 (*)    GFR calc Af Amer 85 (*)    All other components within normal limits  CBC - Abnormal; Notable for the following:    RBC 4.06 (*)    Hemoglobin 12.7 (*)    HCT 38.1 (*)    All other components within normal limits  CDS SEROLOGY  ETHANOL  PROTIME-INR  URINALYSIS, ROUTINE W REFLEX MICROSCOPIC  TYPE AND SCREEN  PREPARE FRESH FROZEN PLASMA  ABO/RH  Imaging Review Dg Chest Port 1 View  10/15/2013   CLINICAL DATA:  trauma  EXAM: PORTABLE CHEST - 1 VIEW  COMPARISON:  None.  FINDINGS: Low lung volumes. No pneumothorax. The heart size and mediastinal contours are within normal limits. Both lungs are clear. The visualized skeletal structures are unremarkable.  IMPRESSION: No active disease. No radiographic evidence of posttraumatic abnormalities.   Electronically Signed   By: Salome Holmes M.D.   On: 10/15/2013 09:25   Dg Humerus Left  10/15/2013   CLINICAL DATA:  Laceration, metallic foreign body  EXAM: LEFT HUMERUS - 2+ VIEW  COMPARISON:  None.  FINDINGS: No acute fracture or dislocation. Metallic foreign body in the soft tissues medial to the mid left humeral diaphysis with overlying  soft tissue laceration. The metallic foreign body measures 1.1 x 5.5 cm.  IMPRESSION: Metallic foreign body in the soft tissues medial to the mid left humeral diaphysis with overlying soft tissue laceration.   Electronically Signed   By: Elige Ko   On: 10/15/2013 09:19     EKG Interpretation None      CRITICAL CARE Performed by: Audree Camel   Total critical care time: 40 minutes  Critical care time was exclusive of separately billable procedures and treating other patients.  Critical care was necessary to treat or prevent imminent or life-threatening deterioration.  Critical care was time spent personally by me on the following activities: development of treatment plan with patient and/or surrogate as well as nursing, discussions with consultants, evaluation of patient's response to treatment, examination of patient, obtaining history from patient or surrogate, ordering and performing treatments and interventions, ordering and review of laboratory studies, ordering and review of radiographic studies, pulse oximetry and re-evaluation of patient's condition.  MDM   Final diagnoses:  Injury of left upper extremity    Patient came in as a level I trauma due to his hypotension and concern for arterial injury. There is no extravasation or bleeding at this time.  We're unable to palpate his left radial pulse but are able to get it on the Doppler. He also has faint pulses on the right due to his hypotension. He has poor Refill but this appears to be bilateral. X-ray shows that there is still a large metallic foreign body in his upper arm. No other trauma. He was given 1 L of fluid but still remained hypotensive in the 80s. Due to this he was started on one unit of blood. His blood pressure did normalize. No more bleeding noted. No more injuries. Discussed with orthopedics, Dr. Eulah Pont, who will take to the operating room perform FB removal. Vascular consulted, will get IR angiogram first to  r/o arterial injury to see if he needs to explore as well.    Audree Camel, MD 10/15/13 [phone]

## 2013-10-15 NOTE — ED Notes (Signed)
Vascular speaking with family

## 2013-10-16 ENCOUNTER — Encounter (HOSPITAL_COMMUNITY): Payer: Self-pay | Admitting: Orthopedic Surgery

## 2013-10-16 DIAGNOSIS — G4733 Obstructive sleep apnea (adult) (pediatric): Secondary | ICD-10-CM | POA: Insufficient documentation

## 2013-10-16 DIAGNOSIS — S45102A Unspecified injury of brachial artery, left side, initial encounter: Secondary | ICD-10-CM | POA: Diagnosis present

## 2013-10-16 DIAGNOSIS — S40852A Superficial foreign body of left upper arm, initial encounter: Secondary | ICD-10-CM | POA: Diagnosis present

## 2013-10-16 DIAGNOSIS — E039 Hypothyroidism, unspecified: Secondary | ICD-10-CM | POA: Insufficient documentation

## 2013-10-16 DIAGNOSIS — E119 Type 2 diabetes mellitus without complications: Secondary | ICD-10-CM | POA: Insufficient documentation

## 2013-10-16 DIAGNOSIS — Z9989 Dependence on other enabling machines and devices: Secondary | ICD-10-CM

## 2013-10-16 DIAGNOSIS — D62 Acute posthemorrhagic anemia: Secondary | ICD-10-CM | POA: Diagnosis not present

## 2013-10-16 DIAGNOSIS — G5690 Unspecified mononeuropathy of unspecified upper limb: Secondary | ICD-10-CM

## 2013-10-16 DIAGNOSIS — I10 Essential (primary) hypertension: Secondary | ICD-10-CM | POA: Insufficient documentation

## 2013-10-16 DIAGNOSIS — G5692 Unspecified mononeuropathy of left upper limb: Secondary | ICD-10-CM | POA: Diagnosis not present

## 2013-10-16 LAB — CBC
HCT: 27.6 % — ABNORMAL LOW (ref 39.0–52.0)
HEMOGLOBIN: 9.3 g/dL — AB (ref 13.0–17.0)
MCH: 31.3 pg (ref 26.0–34.0)
MCHC: 33.7 g/dL (ref 30.0–36.0)
MCV: 92.9 fL (ref 78.0–100.0)
Platelets: 227 10*3/uL (ref 150–400)
RBC: 2.97 MIL/uL — ABNORMAL LOW (ref 4.22–5.81)
RDW: 15.1 % (ref 11.5–15.5)
WBC: 14.8 10*3/uL — ABNORMAL HIGH (ref 4.0–10.5)

## 2013-10-16 LAB — GLUCOSE, CAPILLARY
GLUCOSE-CAPILLARY: 103 mg/dL — AB (ref 70–99)
Glucose-Capillary: 161 mg/dL — ABNORMAL HIGH (ref 70–99)
Glucose-Capillary: 158 mg/dL — ABNORMAL HIGH (ref 70–99)
Glucose-Capillary: 124 mg/dL — ABNORMAL HIGH (ref 70–99)

## 2013-10-16 LAB — TYPE AND SCREEN
ABO/RH(D): A POS
ANTIBODY SCREEN: NEGATIVE
UNIT DIVISION: 0
UNIT DIVISION: 0

## 2013-10-16 LAB — BASIC METABOLIC PANEL
BUN: 19 mg/dL (ref 6–23)
CALCIUM: 8.1 mg/dL — AB (ref 8.4–10.5)
CO2: 24 mEq/L (ref 19–32)
Chloride: 101 mEq/L (ref 96–112)
Creatinine, Ser: 1.15 mg/dL (ref 0.50–1.35)
GFR calc Af Amer: 80 mL/min — ABNORMAL LOW (ref >90)
GFR, EST NON AFRICAN AMERICAN: 69 mL/min — AB (ref >90)
GLUCOSE: 129 mg/dL — AB (ref 70–99)
Potassium: 3.6 mEq/L — ABNORMAL LOW (ref 3.7–5.3)
SODIUM: 137 meq/L (ref 137–147)

## 2013-10-16 MED ORDER — METOPROLOL TARTRATE 25 MG PO TABS
25.0000 mg | ORAL_TABLET | Freq: Every day | ORAL | Status: DC
  Administered 2013-10-17: 25 mg via ORAL
  Filled 2013-10-16: qty 1

## 2013-10-16 MED ORDER — OXYCODONE HCL 5 MG PO TABS
10.0000 mg | ORAL_TABLET | ORAL | Status: DC | PRN
  Administered 2013-10-16: 20 mg via ORAL
  Filled 2013-10-16 (×2): qty 4

## 2013-10-16 MED ORDER — TRAMADOL HCL 50 MG PO TABS
100.0000 mg | ORAL_TABLET | Freq: Four times a day (QID) | ORAL | Status: DC
  Administered 2013-10-16 – 2013-10-17 (×3): 100 mg via ORAL
  Filled 2013-10-16 (×3): qty 2

## 2013-10-16 MED ORDER — PROMETHAZINE HCL 25 MG/ML IJ SOLN
12.5000 mg | INTRAMUSCULAR | Status: DC | PRN
  Administered 2013-10-16: 12.5 mg via INTRAVENOUS
  Filled 2013-10-16: qty 1

## 2013-10-16 MED ORDER — AMLODIPINE BESYLATE 5 MG PO TABS
5.0000 mg | ORAL_TABLET | Freq: Every day | ORAL | Status: DC
  Administered 2013-10-17: 5 mg via ORAL
  Filled 2013-10-16: qty 1

## 2013-10-16 MED ORDER — HYDROCHLOROTHIAZIDE 25 MG PO TABS
25.0000 mg | ORAL_TABLET | Freq: Every day | ORAL | Status: DC
  Administered 2013-10-17: 25 mg via ORAL
  Filled 2013-10-16: qty 1

## 2013-10-16 MED ORDER — NAPROXEN 500 MG PO TABS
500.0000 mg | ORAL_TABLET | Freq: Two times a day (BID) | ORAL | Status: DC
  Administered 2013-10-16 – 2013-10-17 (×2): 500 mg via ORAL
  Filled 2013-10-16 (×5): qty 1

## 2013-10-16 MED ORDER — ENOXAPARIN SODIUM 30 MG/0.3ML ~~LOC~~ SOLN
30.0000 mg | Freq: Two times a day (BID) | SUBCUTANEOUS | Status: DC
  Administered 2013-10-16 – 2013-10-17 (×3): 30 mg via SUBCUTANEOUS
  Filled 2013-10-16 (×3): qty 0.3

## 2013-10-16 MED ORDER — PREGABALIN 75 MG PO CAPS
75.0000 mg | ORAL_CAPSULE | Freq: Two times a day (BID) | ORAL | Status: DC
  Administered 2013-10-16 – 2013-10-17 (×3): 75 mg via ORAL
  Filled 2013-10-16 (×3): qty 1

## 2013-10-16 MED ORDER — LEVOTHYROXINE SODIUM 50 MCG PO TABS
50.0000 ug | ORAL_TABLET | Freq: Every day | ORAL | Status: DC
  Administered 2013-10-17: 50 ug via ORAL
  Filled 2013-10-16 (×2): qty 1

## 2013-10-16 MED ORDER — AMLODIPINE BESY-BENAZEPRIL HCL 5-10 MG PO CAPS: 1.0000 | ORAL_CAPSULE | Freq: Every day | ORAL | Status: DC

## 2013-10-16 MED ORDER — VALSARTAN-HYDROCHLOROTHIAZIDE 320-25 MG PO TABS: 1.0000 | ORAL_TABLET | Freq: Every day | ORAL | Status: DC

## 2013-10-16 MED ORDER — IRBESARTAN 300 MG PO TABS
300.0000 mg | ORAL_TABLET | Freq: Every day | ORAL | Status: DC
  Administered 2013-10-17: 300 mg via ORAL
  Filled 2013-10-16: qty 1

## 2013-10-16 MED ORDER — BENAZEPRIL HCL 10 MG PO TABS
10.0000 mg | ORAL_TABLET | Freq: Every day | ORAL | Status: DC
  Administered 2013-10-17: 10 mg via ORAL
  Filled 2013-10-16: qty 1

## 2013-10-16 NOTE — Progress Notes (Signed)
Patient ID: Joshua Nielsen, male   DOB: [birth year], 58 y.o.   MRN: [id number]   LOS: 1 day   Subjective: Pain medicine not very effective. Intermittent paresthesias and pain in left hand.   Objective: Vital signs in last 24 hours: Temp:  [97.5 F (36.4 C)-98.8 F (37.1 C)] 98.8 F (37.1 C) (06/05 0454) Pulse Rate:  [52-103] 80 (06/05 0454) Resp:  [10-17] 16 (06/05 0454) BP: (81-130)/(43-78) 101/58 mmHg (06/05 0454) SpO2:  [90 %-100 %] 93 % (06/05 0454) Weight:  [245 lb (111.131 kg)] 245 lb (111.131 kg) (06/04 0934) Last BM Date: 10/15/13   Laboratory  CBC  Recent Labs  10/15/13 1930 10/16/13 0442  WBC 17.5* 14.8*  HGB 10.2* 9.3*  HCT 31.0* 27.6*  PLT 247 227   BMET  Recent Labs  10/15/13 0906 10/16/13 0442  NA 142 137  K 3.5* 3.6*  CL 99 101  CO2 25 24  GLUCOSE 164* 129*  BUN 14 19  CREATININE 1.09 1.15  CALCIUM 9.7 8.1*   CBG (last 3)   Recent Labs  10/15/13 1644 10/15/13 2233  GLUCAP 149* 180*    Physical Exam General appearance: alert and no distress Resp: clear to auscultation bilaterally Cardio: regular rate and rhythm GI: normal findings: bowel sounds normal and soft, non-tender Extremities: LUE 2+ radial pulse, could not appreciate ulnar   Assessment/Plan: Industrial accident LUE lac, FB, brachial art injury s/p repair -- per ortho/vascular, OT consult LUE neuropathy -- Add Lyrica  ABL anemia -- Moderate, monitor Multiple medical problems -- Home meds FEN -- Increase OxyIR range, add scheduled NSAID, tramadol. SL IV. VTE -- SCD's, Lovenox (increase for weight) Dispo -- Pain control    Freeman Caldron, PA-C Pager: [phone] General Trauma PA Pager: [phone]  10/16/2013

## 2013-10-16 NOTE — Progress Notes (Signed)
  [facility] Progress Note  10/16/2013 1:27 PM 1 Day Post-Op  Subjective: Pt complaining of hand numbness and pain.    Filed Vitals:   10/16/13 1002  BP: 120/59  Pulse: 82  Temp: 98.6 F (37 C)  Resp: 16    Physical Exam: Incisions:  Left arm dressed with some sanguinous drainage. Staples lines intact Extremities: L radial pulse palpable. 4/5 left grip strength. Sensation intact. Doppler signals of radial, ulnar, palmar arch and digits of left extremity.   CBC    Component Value Date/Time   WBC 14.8* 10/16/2013 0442   RBC 2.97* 10/16/2013 0442   HGB 9.3* 10/16/2013 0442   HCT 27.6* 10/16/2013 0442   PLT 227 10/16/2013 0442   MCV 92.9 10/16/2013 0442   MCH 31.3 10/16/2013 0442   MCHC 33.7 10/16/2013 0442   RDW 15.1 10/16/2013 0442    BMET    Component Value Date/Time   NA 137 10/16/2013 0442   K 3.6* 10/16/2013 0442   CL 101 10/16/2013 0442   CO2 24 10/16/2013 0442   GLUCOSE 129* 10/16/2013 0442   BUN 19 10/16/2013 0442   CREATININE 1.15 10/16/2013 0442   CALCIUM 8.1* 10/16/2013 0442   GFRNONAA 69* 10/16/2013 0442   GFRAA 80* 10/16/2013 0442    INR    Component Value Date/Time   INR 1.08 10/15/2013 0906     Intake/Output Summary (Last 24 hours) at 10/16/13 1327 Last data filed at 10/16/13 0600  Gross per 24 hour  Intake   4140 ml  Output   2330 ml  Net   1810 ml     Assessment:  58 y.o. male is s/p:  Primary repair left brachial artery  1 Day Post-Op  Plan: -Doppler signals of radial, ulnar, palmer arch and digits.  -Follow up with Dr. Myra Gianotti in 2 weeks -Ok to D/C from our standpoint -D/C per trauma/ortho   Maris Berger, PA-C [facility] Office: [phone] Pager: [phone] 10/16/2013 1:27 PM

## 2013-10-16 NOTE — Progress Notes (Signed)
UR completed 

## 2013-10-16 NOTE — Progress Notes (Signed)
I participated in the care of this patient and agree with the above history, physical and evaluation. I performed a review of the history and a physical exam as detailed   Timothy Daniel Murphy MD  

## 2013-10-16 NOTE — Clinical Social Work Psychosocial (Signed)
Clinical Social Work Department BRIEF PSYCHOSOCIAL ASSESSMENT 10/16/2013  Patient:  Joshua Nielsen, Joshua Nielsen     Account Number:  [id number]     Admit date:  10/15/2013  Clinical Social Worker:  Cristy Folks  Date/Time:  10/16/2013 02:55 PM  Referred by:  CSW  Date Referred:  10/16/2013 Referred for  Other - See comment   Other Referral:   SBIRT   Interview type:  Patient Other interview type:    PSYCHOSOCIAL DATA Living Status:  FAMILY Admitted from facility:   Level of care:   Primary support name:  Celestia Khat Primary support relationship to patient:  SPOUSE Degree of support available:   Good Support    CURRENT CONCERNS  Other Concerns:   N/A    SOCIAL WORK ASSESSMENT / PLAN CSW spoke with pt in regards his stay at hospital. 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 Pt has a very supportive wife, Corrie Dandy and they have been married for 36 years. SBIRT completed. Pt scored low on assessment. Pt was given some medication to assist with sleep and was dosing off. CSW asked if there were any questions or concerns. Pt has no current concerns.   Assessment/plan status:  No Further Intervention Required Other assessment/ plan:   N/A   Information/referral to community resources:   N/A    PATIENT'S/FAMILY'S RESPONSE TO PLAN OF CARE: CSW role explained to pt. Pt voiced understanding. Pt appreciative of role. No further CSW needed.    [address], [place] 021-1173

## 2013-10-16 NOTE — Progress Notes (Signed)
Patient sleepy and arousable.  BP soft.  BP meds have been held.  Pain currently well controlled.  Possibly home tomorrow.This patient has been seen and I agree with the findings and treatment plan.  Marta Lamas. Gae Bon, MD, FACS [phone] (pager) [phone] (direct pager) Trauma Surgeon

## 2013-10-16 NOTE — Progress Notes (Signed)
Patient ID: Joshua Nielsen, male   DOB: 11-04-1955, 58 y.o.   MRN: [id number]     Subjective:  Patient reports pain as mild to moderate.  Patient C/O nausea and that his hand was numb last night.  Feeling returning more today  Objective:   VITALS:   Filed Vitals:   10/15/13 2053 10/15/13 2206 10/16/13 0216 10/16/13 0454  BP: 88/50 89/50 100/61 101/58  Pulse: 87 83 82 80  Temp: 98.5 F (36.9 C) 98.6 F (37 C) 98.5 F (36.9 C) 98.8 F (37.1 C)  TempSrc: Oral Oral Oral Oral  Resp: 15 16 16 16   Height:      Weight:      SpO2: 94% 90% 98% 93%    ABD soft Sensation intact distally Dorsiflexion/Plantar flexion intact Incision: dressing C/D/I and moderate drainage Hand is pink radial and ulnar pulses strong   Lab Results  Component Value Date   WBC 14.8* 10/16/2013   HGB 9.3* 10/16/2013   HCT 27.6* 10/16/2013   MCV 92.9 10/16/2013   PLT 227 10/16/2013     Assessment/Plan: 1 Day Post-Op   Active Problems:   Laceration of left upper extremity   Advance diet Up with therapy Continue plan per Dr Cherlynn June for motion if okay with vasc.    Janace Litten 10/16/2013, 7:28 AM   Margarita Rana MD [phone]

## 2013-10-17 LAB — CBC
HCT: 26.1 % — ABNORMAL LOW (ref 39.0–52.0)
Hemoglobin: 8.5 g/dL — ABNORMAL LOW (ref 13.0–17.0)
MCH: 30.9 pg (ref 26.0–34.0)
MCHC: 32.6 g/dL (ref 30.0–36.0)
MCV: 94.9 fL (ref 78.0–100.0)
PLATELETS: 198 10*3/uL (ref 150–400)
RBC: 2.75 MIL/uL — ABNORMAL LOW (ref 4.22–5.81)
RDW: 15.5 % (ref 11.5–15.5)
WBC: 13.3 10*3/uL — ABNORMAL HIGH (ref 4.0–10.5)

## 2013-10-17 LAB — GLUCOSE, CAPILLARY: Glucose-Capillary: 127 mg/dL — ABNORMAL HIGH (ref 70–99)

## 2013-10-17 MED ORDER — ASPIRIN 81 MG PO TBEC: 81.0000 mg | DELAYED_RELEASE_TABLET | Freq: Every day | ORAL | Status: DC

## 2013-10-17 MED ORDER — NAPROXEN 500 MG PO TABS: 500.0000 mg | ORAL_TABLET | Freq: Two times a day (BID) | ORAL | Status: DC

## 2013-10-17 MED ORDER — OXYCODONE HCL 10 MG PO TABS: 10.0000 mg | ORAL_TABLET | ORAL | Status: DC | PRN

## 2013-10-17 MED ORDER — TRAMADOL HCL 50 MG PO TABS: 100.0000 mg | ORAL_TABLET | Freq: Four times a day (QID) | ORAL | Status: AC

## 2013-10-17 MED ORDER — POLYETHYLENE GLYCOL 3350 17 G PO PACK: 17.0000 g | PACK | Freq: Every day | ORAL | Status: DC

## 2013-10-17 MED ORDER — DSS 100 MG PO CAPS: 100.0000 mg | ORAL_CAPSULE | Freq: Two times a day (BID) | ORAL | Status: DC

## 2013-10-17 MED ORDER — PREGABALIN 75 MG PO CAPS: 75.0000 mg | ORAL_CAPSULE | Freq: Two times a day (BID) | ORAL | Status: DC

## 2013-10-17 NOTE — Progress Notes (Signed)
Patient ID: Joshua Nielsen, male   DOB: [birth year], 58 y.o.   MRN: [id number] 2 Days Post-Op  Subjective: Better pain control. No specific complaints. Still some generalized numbness in his hand that comes and goes.  Objective: Vital signs in last 24 hours: Temp:  [97.6 F (36.4 C)-98.9 F (37.2 C)] 98.5 F (36.9 C) (06/06 0505) Pulse Rate:  [71-83] 74 (06/06 0505) Resp:  [16-18] 17 (06/06 0505) BP: (82-130)/(47-63) 130/63 mmHg (06/06 0505) SpO2:  [90 %-95 %] 95 % (06/06 0505) Last BM Date: 10/16/13  Intake/Output from previous day: 06/05 0701 - 06/06 0700 In: 600 [P.O.:600] Out: 150 [Urine:150] Intake/Output this shift:    General appearance: alert, cooperative and no distress Extremities: no edema. No motor deficits. Strong palpable radial pulse. Sensation intact but somewhat diminished diffusely Incision/Wound: dressing clean and dry  Lab Results:   Recent Labs  10/16/13 0442 10/17/13 0505  WBC 14.8* 13.3*  HGB 9.3* 8.5*  HCT 27.6* 26.1*  PLT 227 198   BMET  Recent Labs  10/15/13 0906 10/16/13 0442  NA 142 137  K 3.5* 3.6*  CL 99 101  CO2 25 24  GLUCOSE 164* 129*  BUN 14 19  CREATININE 1.09 1.15  CALCIUM 9.7 8.1*     Studies/Results: Ir Angiogram Extremity Left  10/15/2013   CLINICAL DATA:  58 year old male with a history of shrapnel wound to the left medial arm secondary to a malfunction of the metal press. He has a large metallic fragment adjacent to the humeral shaft and nearly undetectable pulses at the wrist concerning for arterial vascular injury. Planning arteriogram prior to vascular surgical intervention.  EXAM: LEFT EXTREMITY ARTERIOGRAPHY; IR ULTRASOUND GUIDANCE VASC ACCESS RIGHT  Date: 10/15/2013  PROCEDURE: 1. Ultrasound-guided puncture of the right common femoral vein 2. Catheterization of the left subclavian artery with arteriogram 3. Catheterization of the left brachial artery with arteriogram 4. Limited right common femoral arteriogram 5. Extra  arterial assisted hemostasis with Cordis ExoSeal device Interventional Radiologist:  Sterling Big, MD  ANESTHESIA/SEDATION: Moderate (conscious) sedation was used. Three mg Versed, 75 mcg Fentanyl were administered intravenously. The patient's vital signs were monitored continuously by radiology nursing throughout the procedure.  Sedation Time: 38 minutes  FLUOROSCOPY TIME:  6 minutes 30 seconds  CONTRAST:  1mL VISIPAQUE IODIXANOL 320 MG/ML IV SOLN  TECHNIQUE: Informed consent was obtained from the patient following explanation of the procedure, risks, benefits and alternatives. The patient understands, agrees and consents for the procedure. All questions were addressed. A time out was performed.  Maximal barrier sterile technique utilized including caps, mask, sterile gowns, sterile gloves, large sterile drape, hand hygiene, and Betadine skin prep.  The right groin was interrogated with ultrasound. The right common femoral artery was found to be widely patent. No significant atherosclerotic plaque. Min images obtained stored for the medical record. Local anesthesia was attained by infiltration with 1% lidocaine. A small dermatotomy was made. Under real-time sonographic guidance, the vessel was punctured above the bifurcation with a 21 gauge micropuncture needle. An image was again save in stored for the medical record. With CHF a transitional 5 French micro sheath, a Bentson wire was advanced and the abdominal aorta. The micro sheath was then exchanged for working 5 [place] vascular sheath. The catheter was then advanced into the abdominal aorta and used to select the left subclavian artery. A left subclavian arteriogram was performed. The vertebral artery is slightly hyper trophic. Otherwise, no abnormality of the subclavian artery origin current vessels. The catheter  was advanced over a glidewire into the proximal brachial artery and a left upper extremity arteriogram was performed. The circumflex humeral  and profunda brachii arteries are unremarkable. In the mid arm, there is an 8 cm fragment of irregular metal immediately adjacent to and contacting the artery. On the oblique views, the metal shard appears to nearly transect the vessel. There is a small amount of filling defect around the metallic fragment likely representing thrombus. No evidence of active hemorrhage or extravasation of contrast. Contrast flow beyond the level of the injury is extremely slow. Normal trifurcation just distal to the antecubital fossa. Despite prolonged imaging and a large volume contrast injection, flow could barely be detected in the ulnar and radial arteries at the wrist.  The catheter was removed. A limited right common femoral arteriogram was performed confirming access in the common femoral artery below the inguinal ligament. Hemostasis was assisted with a Cordis ExoSeal extra arterial closure device.  IMPRESSION: 1. Left upper extremity arteriogram demonstrates injury of the mid brachial artery secondary to the adjacent metallic shard. The shard nearly transects the vessel. However, there is no evidence of active bleeding. A small amount of filling defect surrounding the metallic shard likely represents thrombus/platelet plug at the injury site. Given the absence of active arterial extravasation, near occlusive focal external compression of the artery by the metallic fragment is possible but considered less likely. 2. Extremely slow flow to the distal forearm and hand secondary to the near occlusive injury to the mid brachial artery. 3. Normal arterial anatomy. No evidence of high origin of the radial artery. These results were called by telephone at the time of interpretation on 10/15/2013 at 11:50 PM to Dr. Coral ElseVANCE BRABHAM via the operating room circulating nurse, who verbally acknowledged these results.  Signed,  Sterling BigHeath K. McCullough, MD  [facility]  [facility]   Electronically  Signed   By: Malachy MoanHeath  McCullough M.D.   On: 10/15/2013 17:53   Ir [place] Guide Vasc Access Right  10/15/2013   CLINICAL DATA:  58 year old male with a history of shrapnel wound to the left medial arm secondary to a malfunction of the metal press. He has a large metallic fragment adjacent to the humeral shaft and nearly undetectable pulses at the wrist concerning for arterial vascular injury. Planning arteriogram prior to vascular surgical intervention.  EXAM: LEFT EXTREMITY ARTERIOGRAPHY; IR ULTRASOUND GUIDANCE VASC ACCESS RIGHT  Date: 10/15/2013  PROCEDURE: 1. Ultrasound-guided puncture of the right common femoral vein 2. Catheterization of the left subclavian artery with arteriogram 3. Catheterization of the left brachial artery with arteriogram 4. Limited right common femoral arteriogram 5. Extra arterial assisted hemostasis with Cordis ExoSeal device Interventional Radiologist:  Sterling BigHeath K. McCullough, MD  ANESTHESIA/SEDATION: Moderate (conscious) sedation was used. Three mg Versed, 75 mcg Fentanyl were administered intravenously. The patient's vital signs were monitored continuously by radiology nursing throughout the procedure.  Sedation Time: 38 minutes  FLUOROSCOPY TIME:  6 minutes 30 seconds  CONTRAST:  1mL VISIPAQUE IODIXANOL 320 MG/ML IV SOLN  TECHNIQUE: Informed consent was obtained from the patient following explanation of the procedure, risks, benefits and alternatives. The patient understands, agrees and consents for the procedure. All questions were addressed. A time out was performed.  Maximal barrier sterile technique utilized including caps, mask, sterile gowns, sterile gloves, large sterile drape, hand hygiene, and Betadine skin prep.  The right groin was interrogated with ultrasound. The right common femoral artery was found to be widely patent. No significant atherosclerotic  plaque. Min images obtained stored for the medical record. Local anesthesia was attained by infiltration with 1% lidocaine. A  small dermatotomy was made. Under real-time sonographic guidance, the vessel was punctured above the bifurcation with a 21 gauge micropuncture needle. An image was again save in stored for the medical record. With CHF a transitional 5 French micro sheath, a Bentson wire was advanced and the abdominal aorta. The micro sheath was then exchanged for working 5 [place] vascular sheath. The catheter was then advanced into the abdominal aorta and used to select the left subclavian artery. A left subclavian arteriogram was performed. The vertebral artery is slightly hyper trophic. Otherwise, no abnormality of the subclavian artery origin current vessels. The catheter was advanced over a glidewire into the proximal brachial artery and a left upper extremity arteriogram was performed. The circumflex humeral and profunda brachii arteries are unremarkable. In the mid arm, there is an 8 cm fragment of irregular metal immediately adjacent to and contacting the artery. On the oblique views, the metal shard appears to nearly transect the vessel. There is a small amount of filling defect around the metallic fragment likely representing thrombus. No evidence of active hemorrhage or extravasation of contrast. Contrast flow beyond the level of the injury is extremely slow. Normal trifurcation just distal to the antecubital fossa. Despite prolonged imaging and a large volume contrast injection, flow could barely be detected in the ulnar and radial arteries at the wrist.  The catheter was removed. A limited right common femoral arteriogram was performed confirming access in the common femoral artery below the inguinal ligament. Hemostasis was assisted with a Cordis ExoSeal extra arterial closure device.  IMPRESSION: 1. Left upper extremity arteriogram demonstrates injury of the mid brachial artery secondary to the adjacent metallic shard. The shard nearly transects the vessel. However, there is no evidence of active bleeding. A small  amount of filling defect surrounding the metallic shard likely represents thrombus/platelet plug at the injury site. Given the absence of active arterial extravasation, near occlusive focal external compression of the artery by the metallic fragment is possible but considered less likely. 2. Extremely slow flow to the distal forearm and hand secondary to the near occlusive injury to the mid brachial artery. 3. Normal arterial anatomy. No evidence of high origin of the radial artery. These results were called by telephone at the time of interpretation on 10/15/2013 at 11:50 PM to Dr. Coral Else via the operating room circulating nurse, who verbally acknowledged these results.  Signed,  Sterling Big, MD  [facility]  [facility]   Electronically Signed   By: Malachy Moan M.D.   On: 10/15/2013 17:53   Dg Chest Port 1 View  10/15/2013   CLINICAL DATA:  trauma  EXAM: PORTABLE CHEST - 1 VIEW  COMPARISON:  None.  FINDINGS: Low lung volumes. No pneumothorax. The heart size and mediastinal contours are within normal limits. Both lungs are clear. The visualized skeletal structures are unremarkable.  IMPRESSION: No active disease. No radiographic evidence of posttraumatic abnormalities.   Electronically Signed   By: Salome Holmes M.D.   On: 10/15/2013 09:25   Dg Humerus Left  10/15/2013   CLINICAL DATA:  Laceration, metallic foreign body  EXAM: LEFT HUMERUS - 2+ VIEW  COMPARISON:  None.  FINDINGS: No acute fracture or dislocation. Metallic foreign body in the soft tissues medial to the mid left humeral diaphysis with overlying soft tissue laceration. The metallic foreign body measures 1.1 x 5.5  cm.  IMPRESSION: Metallic foreign body in the soft tissues medial to the mid left humeral diaphysis with overlying soft tissue laceration.   Electronically Signed   By: Elige Ko   On: 10/15/2013 09:19    Anti-infectives: Anti-infectives   Start     Dose/Rate Route  Frequency Ordered Stop   10/15/13 1915  ceFAZolin (ANCEF) IVPB 2 g/50 mL premix     2 g 100 mL/hr over 30 Minutes Intravenous Nielsen 6 hours 10/15/13 1906 10/16/13 0729   10/15/13 1240  ceFAZolin (ANCEF) 2-3 GM-% IVPB SOLR    Comments:  [place] [place]   : cabinet override      10/15/13 1240 10/15/13 1307      Assessment/Plan: s/p Procedure(s): WOUND EXPLORATION - LEFT ARM OPERATIVE SITE IRRIGATION AND DEBRIDEMENT EXTREMITY  PRIMARY BRACHIAL ARTERY REPAIR Doing well without apparent complication. Appears ready for discharge when okay with vascular and ortho    LOS: 2 days    Mariella Saa 10/17/2013
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
## 2013-10-17 NOTE — Discharge Instructions (Signed)
You may shower in 24 hours.  Change dressing daily after you shower and keep the wound clean.

## 2013-10-17 NOTE — Progress Notes (Addendum)
[facility]  Subjective  - Doing well over all. He states that his fingers feel numb sometimes and when he moves them they feel better.   Objective 130/63 74 98.5 F (36.9 C) (Oral) 17 95%  Intake/Output Summary (Last 24 hours) at 10/17/13 0808 Last data filed at 10/16/13 1414  Gross per 24 hour  Intake    600 ml  Output    150 ml  Net    450 ml    Left hand active range of motion intact, sensation slight decreased compared to right. Doppler Radial> Ulnar pulses Incision C/D.  No active bleeding.  Min. Oozing distal incision.  Assessment/Planning: POD #2 Primary repair left brachial artery  Ok to D/C from vascular stand point F/U ion the office with Dr. Myra Gianotti in 2 weeks Left arm dressing changed would wait 24 hours until showering.   Lars Mage 10/17/2013 8:08 AM  Agree with above. Palpable left radial pulse. Okay for discharge from vascular standpoint.  Waverly Ferrari, MD, FACS Beeper [phone] 10/17/2013

## 2013-10-17 NOTE — Progress Notes (Signed)
Discharge home. Home discharge instructions given, no questions verbalized. 

## 2013-10-17 NOTE — Progress Notes (Signed)
Subjective: 2 Days Post-Op Procedure(s) (LRB): WOUND EXPLORATION - LEFT ARM OPERATIVE SITE (Left) IRRIGATION AND DEBRIDEMENT EXTREMITY  (Left) PRIMARY BRACHIAL ARTERY REPAIR (Left) Patient reports pain as mild.    Objective: Vital signs in last 24 hours: Temp:  [97.6 F (36.4 C)-98.9 F (37.2 C)] 98.5 F (36.9 C) (06/06 0505) Pulse Rate:  [71-83] 74 (06/06 0505) Resp:  [16-18] 17 (06/06 0505) BP: (82-130)/(47-63) 130/63 mmHg (06/06 0505) SpO2:  [90 %-95 %] 95 % (06/06 0505)  Intake/Output from previous day: 06/05 0701 - 06/06 0700 In: 600 [P.O.:600] Out: 150 [Urine:150] Intake/Output this shift:     Recent Labs  10/15/13 0906 10/15/13 1930 10/16/13 0442 10/17/13 0505  HGB 12.7* 10.2* 9.3* 8.5*    Recent Labs  10/16/13 0442 10/17/13 0505  WBC 14.8* 13.3*  RBC 2.97* 2.75*  HCT 27.6* 26.1*  PLT 227 198    Recent Labs  10/15/13 0906 10/16/13 0442  NA 142 137  K 3.5* 3.6*  CL 99 101  CO2 25 24  BUN 14 19  CREATININE 1.09 1.15  GLUCOSE 164* 129*  CALCIUM 9.7 8.1*    Recent Labs  10/15/13 0906  INR 1.08    Sensation intact distally Incision: dressing C/D/I  Assessment/Plan: 2 Days Post-Op Procedure(s) (LRB): WOUND EXPLORATION - LEFT ARM OPERATIVE SITE (Left) IRRIGATION AND DEBRIDEMENT EXTREMITY  (Left) PRIMARY BRACHIAL ARTERY REPAIR (Left) May d/c home from ortho standpoint Motion and lifting per vasc recommendations Dressing change prn   Margart Sickles 10/17/2013, 9:04 AM

## 2013-10-19 ENCOUNTER — Encounter (HOSPITAL_COMMUNITY): Payer: Self-pay | Admitting: Anesthesiology

## 2013-10-19 NOTE — Progress Notes (Signed)
I agree with the above  Wells Brabham 

## 2013-10-30 ENCOUNTER — Encounter: Payer: Self-pay | Admitting: Surgery

## 2013-11-02 ENCOUNTER — Ambulatory Visit (INDEPENDENT_AMBULATORY_CARE_PROVIDER_SITE_OTHER): Payer: Worker's Compensation | Admitting: Surgery

## 2013-11-02 ENCOUNTER — Encounter: Payer: Self-pay | Admitting: Surgery

## 2013-11-02 VITALS — BP 123/76 | HR 57 | Ht 70.5 in | Wt 234.5 lb

## 2013-11-02 DIAGNOSIS — S159XXS Injury of unspecified blood vessel at neck level, sequela: Secondary | ICD-10-CM

## 2013-11-02 DIAGNOSIS — S75909S Unspecified injury of unspecified blood vessel at hip and thigh level, unspecified leg, sequela: Secondary | ICD-10-CM

## 2013-11-02 DIAGNOSIS — S45112S Laceration of brachial artery, left side, sequela: Secondary | ICD-10-CM

## 2013-11-02 DIAGNOSIS — S55909S Unspecified injury of unspecified blood vessel at forearm level, unspecified arm, sequela: Secondary | ICD-10-CM

## 2013-11-02 DIAGNOSIS — S45909S Unspecified injury of unspecified blood vessel at shoulder and upper arm level, unspecified arm, sequela: Secondary | ICD-10-CM

## 2013-11-02 DIAGNOSIS — Z48812 Encounter for surgical aftercare following surgery on the circulatory system: Secondary | ICD-10-CM

## 2013-11-02 DIAGNOSIS — S090XXS Injury of blood vessels of head, not elsewhere classified, sequela: Secondary | ICD-10-CM

## 2013-11-02 DIAGNOSIS — S95909S Unspecified injury of unspecified blood vessel at ankle and foot level, unspecified leg, sequela: Secondary | ICD-10-CM

## 2013-11-02 DIAGNOSIS — T148XXA Other injury of unspecified body region, initial encounter: Secondary | ICD-10-CM

## 2013-11-02 DIAGNOSIS — S85909S Unspecified injury of unspecified blood vessel at lower leg level, unspecified leg, sequela: Secondary | ICD-10-CM

## 2013-11-02 NOTE — Progress Notes (Signed)
This is the patient's first postoperative visit.  He presented to the emergency department on 10/15/2013 as a trauma.  This was a work related incident.  He had a foreign body projectile landed in his left arm.  Angiography revealed arterial injury.  He ultimately underwent arterial repair.  At the time of operation I identified the brachial artery had approximately a 90% transection.  This was able to be repaired primarily.  I did trim off the proximal and distal edges of the artery to get good healthy tissue approximation.  The patient was found to have a high takeoff of his radial artery.  He was ultimately discharged from the hospital on a baby aspirin.  He is here today for followup.  He reports having numbness and pins and needles along the outside of his forearm and hand.  He reports numbness around his palm and first and second finger.  His strength he feels is pretty good  On examination he has a palpable radial pulse.  His incision has healed.  I removed the staples today.  He has numbness in the distribution of the thumb, index and middle finger.  Overall the patient is recovering nicely.  I do think that he is not yet ready to return to work and will likely need 3 months for recovery.  I am somewhat concerned that he's numbness is so profound.  I'm going to refer him to a hand specialist for further evaluation.  I suspect nothing will be able to be done but I would like to get their second opinion.  He would need to come back in 3 months for an ultrasound of his repair to make sure no stenosis is identified.  I did discuss the symptoms of arterial occlusion should they occur.  He understands he may require treatment in the future to prevent occlusion of his repair.

## 2013-11-06 ENCOUNTER — Other Ambulatory Visit: Payer: Self-pay | Admitting: Orthopedic Surgery

## 2013-11-09 ENCOUNTER — Encounter (HOSPITAL_COMMUNITY): Payer: Self-pay | Admitting: Pharmacy Technician

## 2013-11-11 ENCOUNTER — Encounter (HOSPITAL_COMMUNITY): Payer: Self-pay

## 2013-11-11 ENCOUNTER — Encounter (HOSPITAL_COMMUNITY)
Admission: RE | Admit: 2013-11-11 | Discharge: 2013-11-11 | Disposition: A | Discharge status: Home / Self Care | Payer: Worker's Compensation | Source: Ambulatory Visit | Attending: Orthopedic Surgery | Admitting: Orthopedic Surgery

## 2013-11-11 ENCOUNTER — Encounter (HOSPITAL_COMMUNITY)
Admission: RE | Admit: 2013-11-11 | Discharge: 2013-11-11 | Disposition: A | Discharge status: Home / Self Care | Payer: Worker's Compensation | Source: Ambulatory Visit | Attending: Anesthesiology | Admitting: Anesthesiology

## 2013-11-11 ENCOUNTER — Other Ambulatory Visit (HOSPITAL_COMMUNITY): Payer: Self-pay | Admitting: *Deleted

## 2013-11-11 DIAGNOSIS — Z01812 Encounter for preprocedural laboratory examination: Secondary | ICD-10-CM | POA: Insufficient documentation

## 2013-11-11 DIAGNOSIS — Z01818 Encounter for other preprocedural examination: Secondary | ICD-10-CM | POA: Diagnosis present

## 2013-11-11 DIAGNOSIS — Z01810 Encounter for preprocedural cardiovascular examination: Secondary | ICD-10-CM | POA: Insufficient documentation

## 2013-11-11 LAB — BASIC METABOLIC PANEL
ANION GAP: 15 (ref 5–15)
BUN: 12 mg/dL (ref 6–23)
CALCIUM: 9.5 mg/dL (ref 8.4–10.5)
CO2: 25 mEq/L (ref 19–32)
Chloride: 101 mEq/L (ref 96–112)
Creatinine, Ser: 0.82 mg/dL (ref 0.50–1.35)
GFR calc Af Amer: >90 mL/min (ref >90)
GLUCOSE: 161 mg/dL — AB (ref 70–99)
POTASSIUM: 3.5 meq/L — AB (ref 3.7–5.3)
SODIUM: 141 meq/L (ref 137–147)

## 2013-11-11 LAB — CBC
HCT: 36.7 % — ABNORMAL LOW (ref 39.0–52.0)
Hemoglobin: 11.2 g/dL — ABNORMAL LOW (ref 13.0–17.0)
MCH: 28.5 pg (ref 26.0–34.0)
MCHC: 30.5 g/dL (ref 30.0–36.0)
MCV: 93.4 fL (ref 78.0–100.0)
PLATELETS: 242 10*3/uL (ref 150–400)
RBC: 3.93 MIL/uL — AB (ref 4.22–5.81)
RDW: 14.4 % (ref 11.5–15.5)
WBC: 6.1 10*3/uL (ref 4.0–10.5)

## 2013-11-11 NOTE — Pre-Procedure Instructions (Signed)
Joshua Nielsen  11/11/2013   Your procedure is scheduled on:  Thursday, November 19, 2013 at 12:30 PM.   [facility] at 10:30 AM.   Call this number if you have problems the morning of surgery: [phone]   Remember:   Do not eat food or drink liquids after midnight Wednesday, 11/13/13.   Take these medicines the morning of surgery with A SIP OF WATER: levothyroxine (SYNTHROID, LEVOTHROID), metoprolol tartrate (LOPRESSOR), pregabalin (LYRICA), EPINEPHrine (EPIPEN) - if needed  Stop Aspirin as of today.  Do not take your diabetic medicine the morning of surgery.    Do not wear jewelry.  Do not wear lotions, powders, or perfumes. You may wear deodorant.  Men may shave face and neck.  Do not bring valuables to the hospital.  [facility] is not responsible                  for any belongings or valuables.               Contacts, dentures or bridgework may not be worn into surgery.  Leave suitcase in the car. After surgery it may be brought to your room.  For patients admitted to the hospital, discharge time is determined by your                treatment team.                Special Instructions: [place] - Preparing for Surgery  Before surgery, you can play an important role.  Because skin is not sterile, your skin needs to be as free of germs as possible.  You can reduce the number of germs on you skin by washing with CHG (chlorahexidine gluconate) soap before surgery.  CHG is an antiseptic cleaner which kills germs and bonds with the skin to continue killing germs even after washing.  Please DO NOT use if you have an allergy to CHG or antibacterial soaps.  If your skin becomes reddened/irritated stop using the CHG and inform your nurse when you arrive at Short Stay.  Do not shave (including legs and underarms) for at least 48 hours prior to the first CHG shower.  You may shave your face.  Please follow these instructions carefully:   1.   Shower with CHG Soap the night before surgery and the                                morning of Surgery.  2.  If you choose to wash your hair, wash your hair first as usual with your       normal shampoo.  3.  After you shampoo, rinse your hair and body thoroughly to remove the                      Shampoo.  4.  Use CHG as you would any other liquid soap.  You can apply chg directly       to the skin and wash gently with scrungie or a clean washcloth.  5.  Apply the CHG Soap to your body ONLY FROM THE NECK DOWN.        Do not use on open wounds or open sores.  Avoid contact with your eyes, ears, mouth and genitals (private parts).  Wash genitals (private parts) with your normal soap.  6.  Wash thoroughly,  paying special attention to the area where your surgery        will be performed.  7.  Thoroughly rinse your body with warm water from the neck down.  8.  DO NOT shower/wash with your normal soap after using and rinsing off       the CHG Soap.  9.  Pat yourself dry with a clean towel.            10.  Wear clean pajamas.            11.  Place clean sheets on your bed the night of your first shower and do not        sleep with pets.  Day of Surgery  Do not apply any lotions the morning of surgery.  Please wear clean clothes to the hospital/surgery center.     Please read over the following fact sheets that you were given: Pain Booklet, Coughing and Deep Breathing and Surgical Site Infection Prevention

## 2013-11-18 MED ORDER — CHLORHEXIDINE GLUCONATE 4 % EX LIQD
60.0000 mL | Freq: Once | CUTANEOUS | Status: DC
  Filled 2013-11-18: qty 60

## 2013-11-18 MED ORDER — CEFAZOLIN SODIUM-DEXTROSE 2-3 GM-% IV SOLR
2.0000 g | INTRAVENOUS | Status: AC
  Administered 2013-11-19: 2 g via INTRAVENOUS
  Filled 2013-11-18: qty 50

## 2013-11-18 MED ORDER — SODIUM CHLORIDE 0.45 % IV SOLN: INTRAVENOUS | Status: DC

## 2013-11-19 ENCOUNTER — Ambulatory Visit (HOSPITAL_COMMUNITY)
Admission: RE | Admit: 2013-11-19 | Discharge: 2013-11-19 | Disposition: A | Discharge status: Home / Self Care | Payer: Worker's Compensation | Source: Ambulatory Visit | Attending: Orthopedic Surgery | Admitting: Orthopedic Surgery

## 2013-11-19 ENCOUNTER — Encounter (HOSPITAL_COMMUNITY): Payer: Self-pay | Admitting: Anesthesiology

## 2013-11-19 ENCOUNTER — Encounter (HOSPITAL_COMMUNITY): Payer: Worker's Compensation | Admitting: Anesthesiology

## 2013-11-19 ENCOUNTER — Ambulatory Visit (HOSPITAL_COMMUNITY): Payer: Worker's Compensation | Admitting: Anesthesiology

## 2013-11-19 ENCOUNTER — Encounter (HOSPITAL_COMMUNITY)
Admission: RE | Disposition: A | Discharge status: Home / Self Care | Payer: Self-pay | Source: Ambulatory Visit | Attending: Orthopedic Surgery

## 2013-11-19 DIAGNOSIS — I10 Essential (primary) hypertension: Secondary | ICD-10-CM | POA: Insufficient documentation

## 2013-11-19 DIAGNOSIS — X58XXXS Exposure to other specified factors, sequela: Secondary | ICD-10-CM | POA: Insufficient documentation

## 2013-11-19 DIAGNOSIS — Z7982 Long term (current) use of aspirin: Secondary | ICD-10-CM | POA: Insufficient documentation

## 2013-11-19 DIAGNOSIS — IMO0001 Reserved for inherently not codable concepts without codable children: Secondary | ICD-10-CM | POA: Insufficient documentation

## 2013-11-19 DIAGNOSIS — D649 Anemia, unspecified: Secondary | ICD-10-CM | POA: Insufficient documentation

## 2013-11-19 DIAGNOSIS — G4733 Obstructive sleep apnea (adult) (pediatric): Secondary | ICD-10-CM | POA: Insufficient documentation

## 2013-11-19 DIAGNOSIS — G5610 Other lesions of median nerve, unspecified upper limb: Secondary | ICD-10-CM | POA: Insufficient documentation

## 2013-11-19 DIAGNOSIS — E119 Type 2 diabetes mellitus without complications: Secondary | ICD-10-CM | POA: Insufficient documentation

## 2013-11-19 DIAGNOSIS — E039 Hypothyroidism, unspecified: Secondary | ICD-10-CM | POA: Insufficient documentation

## 2013-11-19 DIAGNOSIS — I739 Peripheral vascular disease, unspecified: Secondary | ICD-10-CM | POA: Insufficient documentation

## 2013-11-19 HISTORY — PX: NERVE AND ARTERY REPAIR: SHX5694

## 2013-11-19 LAB — GLUCOSE, CAPILLARY
GLUCOSE-CAPILLARY: 89 mg/dL (ref 70–99)
Glucose-Capillary: 103 mg/dL — ABNORMAL HIGH (ref 70–99)

## 2013-11-19 SURGERY — NERVE AND ARTERY REPAIR
Anesthesia: General | Site: Arm Upper | Laterality: Left

## 2013-11-19 MED ORDER — OXYCODONE HCL 5 MG PO TABS: 10.0000 mg | ORAL_TABLET | ORAL | Status: DC | PRN

## 2013-11-19 MED ORDER — PROPOFOL 10 MG/ML IV BOLUS
INTRAVENOUS | Status: AC
  Filled 2013-11-19: qty 20

## 2013-11-19 MED ORDER — MIDAZOLAM HCL 5 MG/5ML IJ SOLN
INTRAMUSCULAR | Status: DC | PRN
  Administered 2013-11-19 (×2): 1 mg via INTRAVENOUS

## 2013-11-19 MED ORDER — ONDANSETRON HCL 4 MG/2ML IJ SOLN
INTRAMUSCULAR | Status: AC
  Filled 2013-11-19: qty 2

## 2013-11-19 MED ORDER — MIDAZOLAM HCL 5 MG/5ML IJ SOLN: INTRAMUSCULAR | Status: DC | PRN

## 2013-11-19 MED ORDER — OXYCODONE HCL 5 MG PO TABS
5.0000 mg | ORAL_TABLET | Freq: Once | ORAL | Status: AC | PRN
  Administered 2013-11-19: 5 mg via ORAL

## 2013-11-19 MED ORDER — ARTIFICIAL TEARS OP OINT: TOPICAL_OINTMENT | OPHTHALMIC | Status: DC | PRN

## 2013-11-19 MED ORDER — HYDROMORPHONE HCL PF 1 MG/ML IJ SOLN
INTRAMUSCULAR | Status: AC
  Filled 2013-11-19: qty 1

## 2013-11-19 MED ORDER — BUPIVACAINE HCL (PF) 0.25 % IJ SOLN
INTRAMUSCULAR | Status: AC
  Filled 2013-11-19: qty 30

## 2013-11-19 MED ORDER — LIDOCAINE HCL (CARDIAC) 20 MG/ML IV SOLN
INTRAVENOUS | Status: DC | PRN
  Administered 2013-11-19: 100 mg via INTRAVENOUS

## 2013-11-19 MED ORDER — FENTANYL CITRATE 0.05 MG/ML IJ SOLN
INTRAMUSCULAR | Status: AC
  Filled 2013-11-19: qty 5

## 2013-11-19 MED ORDER — HYDROMORPHONE HCL PF 1 MG/ML IJ SOLN
0.2500 mg | INTRAMUSCULAR | Status: DC | PRN
  Administered 2013-11-19 (×2): 0.5 mg via INTRAVENOUS

## 2013-11-19 MED ORDER — SODIUM CHLORIDE 0.9 % IR SOLN
Status: DC | PRN
  Administered 2013-11-19: 1000 mL

## 2013-11-19 MED ORDER — FENTANYL CITRATE 0.05 MG/ML IJ SOLN
INTRAMUSCULAR | Status: DC | PRN
  Administered 2013-11-19 (×2): 50 ug via INTRAVENOUS

## 2013-11-19 MED ORDER — LIDOCAINE HCL (CARDIAC) 20 MG/ML IV SOLN
INTRAVENOUS | Status: AC
  Filled 2013-11-19: qty 5

## 2013-11-19 MED ORDER — MIDAZOLAM HCL 2 MG/2ML IJ SOLN
INTRAMUSCULAR | Status: AC
  Filled 2013-11-19: qty 2

## 2013-11-19 MED ORDER — DEXTROSE 5 % IV SOLN
INTRAVENOUS | Status: DC | PRN
  Administered 2013-11-19: 14:00:00 via INTRAVENOUS

## 2013-11-19 MED ORDER — LACTATED RINGERS IV SOLN
INTRAVENOUS | Status: DC
  Administered 2013-11-19: 11:00:00 via INTRAVENOUS

## 2013-11-19 MED ORDER — OXYCODONE HCL 5 MG/5ML PO SOLN: 5.0000 mg | Freq: Once | ORAL | Status: AC | PRN

## 2013-11-19 MED ORDER — PROPOFOL 10 MG/ML IV BOLUS
INTRAVENOUS | Status: DC | PRN
  Administered 2013-11-19: 50 mg via INTRAVENOUS
  Administered 2013-11-19: 200 mg via INTRAVENOUS

## 2013-11-19 MED ORDER — CEPHALEXIN 500 MG PO CAPS: 500.0000 mg | ORAL_CAPSULE | Freq: Four times a day (QID) | ORAL | Status: DC

## 2013-11-19 MED ORDER — BUPIVACAINE HCL (PF) 0.25 % IJ SOLN
INTRAMUSCULAR | Status: DC | PRN
  Administered 2013-11-19: 30 mL

## 2013-11-19 MED ORDER — METOCLOPRAMIDE HCL 5 MG/ML IJ SOLN
INTRAMUSCULAR | Status: AC
  Filled 2013-11-19: qty 2

## 2013-11-19 MED ORDER — LACTATED RINGERS IV SOLN
INTRAVENOUS | Status: DC | PRN
  Administered 2013-11-19: 12:00:00 via INTRAVENOUS

## 2013-11-19 MED ORDER — OXYCODONE HCL 5 MG PO TABS
ORAL_TABLET | ORAL | Status: AC
  Filled 2013-11-19: qty 1

## 2013-11-19 MED ORDER — METOCLOPRAMIDE HCL 5 MG/ML IJ SOLN
10.0000 mg | Freq: Once | INTRAMUSCULAR | Status: AC | PRN
  Administered 2013-11-19: 10 mg via INTRAVENOUS

## 2013-11-19 MED ORDER — ONDANSETRON HCL 4 MG/2ML IJ SOLN
INTRAMUSCULAR | Status: DC | PRN
  Administered 2013-11-19: 4 mg via INTRAVENOUS

## 2013-11-19 SURGICAL SUPPLY — 56 items
BANDAGE ELASTIC 3 VELCRO ST LF (GAUZE/BANDAGES/DRESSINGS) IMPLANT
BANDAGE ELASTIC 4 VELCRO ST LF (GAUZE/BANDAGES/DRESSINGS) ×3 IMPLANT
BANDAGE GAUZE ELAST BULKY 4 IN (GAUZE/BANDAGES/DRESSINGS) IMPLANT
BNDG COHESIVE 1X5 TAN STRL LF (GAUZE/BANDAGES/DRESSINGS) IMPLANT
CORDS BIPOLAR (ELECTRODE) ×3 IMPLANT
COVER SURGICAL LIGHT HANDLE (MISCELLANEOUS) ×3 IMPLANT
CUFF TOURNIQUET SINGLE 18IN (TOURNIQUET CUFF) ×3 IMPLANT
CUFF TOURNIQUET SINGLE 24IN (TOURNIQUET CUFF) IMPLANT
DECANTER SPIKE VIAL GLASS SM (MISCELLANEOUS) ×3 IMPLANT
DRAPE SURG 17X23 STRL (DRAPES) ×3 IMPLANT
DRSG ADAPTIC 3X8 NADH LF (GAUZE/BANDAGES/DRESSINGS) ×3 IMPLANT
GAUZE SPONGE 2X2 8PLY STRL LF (GAUZE/BANDAGES/DRESSINGS) IMPLANT
GAUZE SPONGE 4X4 16PLY XRAY LF (GAUZE/BANDAGES/DRESSINGS) ×3 IMPLANT
GAUZE XEROFORM 1X8 LF (GAUZE/BANDAGES/DRESSINGS) ×3 IMPLANT
GLOVE BIOGEL M STRL SZ7.5 (GLOVE) IMPLANT
GLOVE BIOGEL PI IND STRL 7.5 (GLOVE) ×1 IMPLANT
GLOVE BIOGEL PI INDICATOR 7.5 (GLOVE) ×2
GLOVE SS BIOGEL STRL SZ 8 (GLOVE) ×1 IMPLANT
GLOVE SUPERSENSE BIOGEL SZ 8 (GLOVE) ×2
GLOVE SURG SS PI 7.0 STRL IVOR (GLOVE) ×6 IMPLANT
GLOVE SURG SS PI 7.5 STRL IVOR (GLOVE) ×3 IMPLANT
GOWN STRL REUS W/ TWL LRG LVL3 (GOWN DISPOSABLE) IMPLANT
GOWN STRL REUS W/ TWL XL LVL3 (GOWN DISPOSABLE) ×3 IMPLANT
GOWN STRL REUS W/TWL LRG LVL3 (GOWN DISPOSABLE)
GOWN STRL REUS W/TWL XL LVL3 (GOWN DISPOSABLE) ×6
KIT BASIN OR (CUSTOM PROCEDURE TRAY) ×3 IMPLANT
KIT ROOM TURNOVER OR (KITS) ×3 IMPLANT
LOOP VESSEL MAXI BLUE (MISCELLANEOUS) ×3 IMPLANT
MANIFOLD NEPTUNE II (INSTRUMENTS) IMPLANT
NEEDLE HYPO 25GX1X1/2 BEV (NEEDLE) ×3 IMPLANT
NS IRRIG 1000ML POUR BTL (IV SOLUTION) ×3 IMPLANT
PACK ORTHO EXTREMITY (CUSTOM PROCEDURE TRAY) ×3 IMPLANT
PAD ARMBOARD 7.5X6 YLW CONV (MISCELLANEOUS) ×6 IMPLANT
PAD CAST 4YDX4 CTTN HI CHSV (CAST SUPPLIES) ×1 IMPLANT
PADDING CAST COTTON 4X4 STRL (CAST SUPPLIES) ×2
SLING ARM FOAM STRAP LRG (SOFTGOODS) ×3 IMPLANT
SOLUTION BETADINE 4OZ (MISCELLANEOUS) ×3 IMPLANT
SPEAR EYE SURG WECK-CEL (MISCELLANEOUS) ×3 IMPLANT
SPECIMEN JAR SMALL (MISCELLANEOUS) IMPLANT
SPONGE GAUZE 2X2 STER 10/PKG (GAUZE/BANDAGES/DRESSINGS)
SPONGE GAUZE 4X4 12PLY (GAUZE/BANDAGES/DRESSINGS) IMPLANT
SPONGE GAUZE 4X4 12PLY STER LF (GAUZE/BANDAGES/DRESSINGS) ×3 IMPLANT
SPONGE SCRUB IODOPHOR (GAUZE/BANDAGES/DRESSINGS) ×3 IMPLANT
SUCTION FRAZIER TIP 10 FR DISP (SUCTIONS) ×3 IMPLANT
SUT MERSILENE 4 0 P 3 (SUTURE) IMPLANT
SUT PROLENE 3 0 PS 2 (SUTURE) ×9 IMPLANT
SUT PROLENE 4 0 PS 2 18 (SUTURE) IMPLANT
SUT VIC AB 2-0 CT1 27 (SUTURE)
SUT VIC AB 2-0 CT1 TAPERPNT 27 (SUTURE) IMPLANT
SYR CONTROL 10ML LL (SYRINGE) ×3 IMPLANT
TOWEL OR 17X24 6PK STRL BLUE (TOWEL DISPOSABLE) ×3 IMPLANT
TOWEL OR 17X26 10 PK STRL BLUE (TOWEL DISPOSABLE) ×3 IMPLANT
TUBE CONNECTING 12'X1/4 (SUCTIONS) ×1
TUBE CONNECTING 12X1/4 (SUCTIONS) ×2 IMPLANT
UNDERPAD 30X30 INCONTINENT (UNDERPADS AND DIAPERS) ×3 IMPLANT
WATER STERILE IRR 1000ML POUR (IV SOLUTION) ×3 IMPLANT

## 2013-11-19 NOTE — Anesthesia Preprocedure Evaluation (Addendum)
Anesthesia Evaluation  Patient identified by MRN, date of birth, ID band Patient awake    Reviewed: Allergy & Precautions, H&P , NPO status , Patient's Chart, lab work & pertinent test results, reviewed documented beta blocker date and time   History of Anesthesia Complications (+) PONV and history of anesthetic complications  Airway Mallampati: II TM Distance: >3 FB Neck ROM: full    Dental  (+) Edentulous Upper, Dental Advisory Given, Teeth Intact   Pulmonary sleep apnea and Continuous Positive Airway Pressure Ventilation ,  breath sounds clear to auscultation        Cardiovascular hypertension, Pt. on home beta blockers + Peripheral Vascular Disease negative cardio ROS  Rhythm:regular     Neuro/Psych  Neuromuscular disease negative neurological ROS  negative psych ROS   GI/Hepatic negative GI ROS, Neg liver ROS,   Endo/Other  diabetes, Well Controlled, Type 2Hypothyroidism   Renal/GU negative Renal ROS  negative genitourinary   Musculoskeletal   Abdominal   Peds  Hematology  (+) anemia ,   Anesthesia Other Findings See surgeon's H&P   Reproductive/Obstetrics negative OB ROS                          Anesthesia Physical Anesthesia Plan  ASA: III  Anesthesia Plan: General   Post-op Pain Management:    Induction: Intravenous  Airway Management Planned: LMA  Additional Equipment:   Intra-op Plan:   Post-operative Plan:   Informed Consent: I have reviewed the patients History and Physical, chart, labs and discussed the procedure including the risks, benefits and alternatives for the proposed anesthesia with the patient or authorized representative who has indicated his/her understanding and acceptance.   Dental Advisory Given  Plan Discussed with: CRNA and Surgeon  Anesthesia Plan Comments:         Anesthesia Quick Evaluation

## 2013-11-19 NOTE — Anesthesia Procedure Notes (Signed)
Procedure Name: LMA Insertion Date/Time: 11/19/2013 2:02 PM Performed by: Darcey NoraJAMES, Maeby Vankleeck B Pre-anesthesia Checklist: Patient identified, Emergency Drugs available, Suction available and Patient being monitored Patient Re-evaluated:Patient Re-evaluated prior to inductionOxygen Delivery Method: Circle system utilized Preoxygenation: Pre-oxygenation with 100% oxygen Intubation Type: IV induction Ventilation: Mask ventilation without difficulty LMA: LMA inserted LMA Size: 5.0 Number of attempts: 1 Placement Confirmation: breath sounds checked- equal and bilateral and positive ETCO2 Tube secured with: Tape (taped across cheeks) Dental Injury: Teeth and Oropharynx as per pre-operative assessment

## 2013-11-19 NOTE — Transfer of Care (Signed)
Immediate Anesthesia Transfer of Care Note  Patient: Joshua Nielsen  Procedure(s) Performed: Procedure(s): LEFT ARM IRRIGATION AND DEBRIDEMENT; SCAR REVISION; MEDIAN NERVE TENOLYSIS AND NEUROLYSIS; ARTERY EXPLORATION (Left)  Patient Location: PACU  Anesthesia Type:General  Level of Consciousness: awake, alert , oriented and patient cooperative  Airway & Oxygen Therapy: Patient Spontanous Breathing and Patient connected to nasal cannula oxygen  Post-op Assessment: Report given to PACU RN, Post -op Vital signs reviewed and stable and Patient moving all extremities  Post vital signs: Reviewed and stable  Complications: No apparent anesthesia complications

## 2013-11-19 NOTE — H&P (Signed)
Joshua Nielsen is an 58 y.o. male.   Chief Complaint: laceration to left arm with median nerve dysthesia and  History of brachial artery repair HPI: Patient presents for evaluation and treatment of the of their upper extremity predicament. The patient denies neck back chest or of abdominal pain. The patient notes that they have no lower extremity problems. The patient from primarily complains of the upper extremity pain noted.  Past Medical History  Diagnosis Date  . PONV (postoperative nausea and vomiting)   . Arthritis     "knees" (09/19/2012)  . Chronic back pain     "neck to lower back" (09/19/2012)  . Anxiety     "white coat syndrome" (09/19/2012)  . Type II diabetes mellitus 05/2012  . Hypertension   . Hypothyroidism   . Complication of anesthesia     "trouble getting BM after back OR"  . OSA on CPAP   . Type II diabetes mellitus   . History of blood transfusion 10/15/2013  . Arthritis     "back and legs" (10/15/2013)    Past Surgical History  Procedure Laterality Date  . Knee arthroscopy Bilateral   . Excisional hemorrhoidectomy  2005  . Anterior cervical decomp/discectomy fusion  2008  . Biceps tendon repair Right 2010  . Appendectomy  1981  . Cholecystectomy  2010  . Hernia repair  2012    "incisional; had to also go in around my navel" (09/19/2012)  . Colonoscopy  2011  . Posterior lumbar fusion  09/19/2012  . Back surgery    . I&d extremity Left 10/15/2013    upper arm w/removal of foreign body  . Artery repair Left 10/15/2013    brachial  . Posterior lumbar fusion  ~ 2012    "put rods in"  . Anterior cervical decomp/discectomy fusion  2009  . Hernia repair  ~ 2007    umbilical  . Cholecystectomy  ~ 2007  . Knee arthroscopy Right X 2  . Knee arthroscopy Left X 1  . Varicose vein surgery Left 1980's  . Wound exploration Left 10/15/2013    Procedure: WOUND EXPLORATION - LEFT ARM OPERATIVE SITE;  Surgeon: Sheral Apley, MD;  Location: [facility] OR;  Service: Orthopedics;   Laterality: Left;  . I&d extremity Left 10/15/2013    Procedure: IRRIGATION AND DEBRIDEMENT EXTREMITY ;  Surgeon: Sheral Apley, MD;  Location: [facility] OR;  Service: Orthopedics;  Laterality: Left;  . Artery repair Left 10/15/2013    Procedure: PRIMARY BRACHIAL ARTERY REPAIR;  Surgeon: Nada Libman, MD;  Location: [facility] OR;  Service: Vascular;  Laterality: Left;    History reviewed. No pertinent family history. Social History:  reports that he has never smoked. He has quit using smokeless tobacco. He reports that he does not drink alcohol or use illicit drugs.  Allergies:  Allergies  Allergen Reactions  . Bee Venom Anaphylaxis  . Levaquin [Levofloxacin] Other (See Comments)    Severe joint pain  . Levofloxacin Other (See Comments)    Severe joint pain  . Demerol [Meperidine] Itching and Rash    Rash is severe    Medications Prior to Admission  Medication Sig Dispense Refill  . amLODipine-benazepril (LOTREL) 5-10 MG per capsule Take 1 capsule by mouth daily.      Marland Kitchen aspirin EC 81 MG EC tablet Take 1 tablet (81 mg total) by mouth daily.      Marland Kitchen docusate sodium 100 MG CAPS Take 100 mg by mouth 2 (two) times daily.  60 capsule  0  . levothyroxine (SYNTHROID, LEVOTHROID) 50 MCG tablet Take 50 mcg by mouth daily before breakfast.      . metFORMIN (GLUCOPHAGE) 500 MG tablet Take 500 mg by mouth daily with breakfast.      . metoprolol tartrate (LOPRESSOR) 25 MG tablet Take 25 mg by mouth daily.      Marland Kitchen. oxyCODONE 10 MG TABS Take 1-2 tablets (10-20 mg total) by mouth every 4 (four) hours as needed (10mg  for mild pain, 15mg  for moderate pain, 20mg  for severe pain).  50 tablet  0  . pregabalin (LYRICA) 75 MG capsule Take 1 capsule (75 mg total) by mouth 2 (two) times daily.  60 capsule  0  . valsartan-hydrochlorothiazide (DIOVAN-HCT) 320-25 MG per tablet Take 1 tablet by mouth every morning.       Marland Kitchen. EPINEPHrine (EPIPEN) 0.3 mg/0.3 mL IJ SOAJ injection Inject 0.3 mg into the muscle once.        Results  for orders placed during the hospital encounter of 11/19/13 (from the past 48 hour(s))  GLUCOSE, CAPILLARY     Status: Abnormal   Collection Time    11/19/13 10:41 AM      Result Value Ref Range   Glucose-Capillary 103 (*) 70 - 99 mg/dL   No results found.  Review of Systems  Constitutional: Negative.   Eyes: Negative.   Respiratory: Negative.   Gastrointestinal: Negative.   Genitourinary: Negative.   Neurological: Negative.     Blood pressure 134/86, pulse 55, temperature 98.4 F (36.9 C), temperature source Oral, resp. rate 20, height 5\' 10"  (1.778 m), weight 104.781 kg (231 lb), SpO2 100.00%. Physical Exammedian nerve dysthesias  Left arm with large keloid laceration Intact motor function The patient is alert and oriented in no acute distress the patient complains of pain in the affected upper extremity.  The patient is noted to have a normal HEENT exam.  Lung fields show equal chest expansion and no shortness of breath  abdomen exam is nontender without distention.  Lower extremity examination does not show any fracture dislocation or blood clot symptoms.  Pelvis is stable neck and back are stable and nontender   Assessment/Plan We are planning surgery for your upper extremity. The risk and benefits of surgery include risk of bleeding infection anesthesia damage to normal structures and failure of the surgery to accomplish its intended goals of relieving symptoms and restoring function with this in mind we'll going to proceed. I have specifically discussed with the patient the pre-and postoperative regime and the does and don'ts and risk and benefits in great detail. Risk and benefits of surgery also include risk of dystrophy chronic nerve pain failure of the healing process to go onto completion and other inherent risks of surgery The relavent the pathophysiology of the disease/injury process, as well as the alternatives for treatment and postoperative course of action has been  discussed in great detail with the patient who desires to proceed.  We will do everything in our power to help you (the patient) restore function to the upper extremity. Is a pleasure to see this patient today.   Plan I&D anf repair as necessary left arm-median nerve  Porche Steinberger III,Yasmin Dibello M 11/19/2013, 1:38 PM

## 2013-11-19 NOTE — Op Note (Signed)
See dictation#[id number] Joshua PeaGramig MD

## 2013-11-19 NOTE — Anesthesia Postprocedure Evaluation (Signed)
Anesthesia Post Note  Patient: Joshua Nielsen  Procedure(s) Performed: Procedure(s) (LRB): LEFT ARM IRRIGATION AND DEBRIDEMENT; SCAR REVISION; MEDIAN NERVE TENOLYSIS AND NEUROLYSIS; ARTERY EXPLORATION (Left)  Anesthesia type: General  Patient location: PACU  Post pain: Pain level controlled  Post assessment: Patient's Cardiovascular Status Stable  Last Vitals:  Filed Vitals:   11/19/13 1600  BP: 116/71  Pulse: 63  Temp:   Resp: 14    Post vital signs: Reviewed and stable  Level of consciousness: alert  Complications: No apparent anesthesia complications

## 2013-11-19 NOTE — Discharge Instructions (Signed)

## 2013-11-20 ENCOUNTER — Encounter (HOSPITAL_COMMUNITY): Payer: Self-pay | Admitting: Orthopedic Surgery

## 2013-11-20 NOTE — Op Note (Signed)
NAME:  Joshua Nielsen, Joshua Nielsen                ACCOUNT NO.:  [id number]  MEDICAL RECORD NO.:  [id number]  LOCATION:  [facility]                         FACILITY:  [facility]  PHYSICIAN:  Dionne AnoWilliam M. Merinda Victorino, M.D.DATE OF BIRTH:  10-21-1955  DATE OF PROCEDURE:  11/19/2013 DATE OF DISCHARGE:  11/19/2013                              OPERATIVE REPORT   PREOPERATIVE DIAGNOSIS:  Status post large metallic injury to the left upper arm with previous brachial artery repair and noted partial median nerve deficit subsequent to the injury with associated neurogenic pain about the median nerve.  POSTOPERATIVE DIAGNOSIS:  Status post large metallic injury to the left upper arm with previous brachial artery repair and noted partial median nerve deficit subsequent to the injury with associated neurogenic pain about the median nerve.  PROCEDURES: 1. Excision of a large keloid deformity about the arm with retained     sutures within the keloid mass, this was an 8-cm section. 2. Tenolysis, tenosynovectomy, brachialis and biceps musculotendinous     juncture. 3. Median nerve exploration and decompression with neurolysis. 4. Intraoperative confirmation of brachial artery patency.  SURGEON:  Dionne AnoWilliam M. Amanda PeaGramig, M.D.  ASSISTANT: 1. Charlena CrossWells Brabham IV, M.D., of Vascular Surgery.  TOURNIQUET TIME:  Less than an hour.  INDICATIONS:  This patient is a pleasant male who had a significant injury to his brachial artery via piece of metal subsequent to repair weeks ago.  He has not gained sensation back in the median nerve distribution.  Given the location of the median nerve and the arterial repair, we have recommended exploration.  OPERATIVE FINDINGS:  This patient had significant thickening about the median nerve and dense scar tissue with adherence about the repair site. The nerve underwent an extensive neurolysis and following that neurolysis, it was noted to not have any significant damage beneath the epineurium.  The  artery was intact and there was a large amount of scar tissue had devitalized, almost metallic fragmentation in the musculature, which we removed.  OPERATION IN DETAIL:  The patient was seen by myself and Anesthesia, taken to the operating suite, underwent a smooth induction of the general anesthesia, this was an LMA general anesthetic.  Preoperative antibiotics were given.  Time-out called.  He was prepped and draped in usual sterile fashion with Betadine scrub and paint.  Following this, the patient underwent a final time-out and the operation began with placement of sterile tourniquet and incision.  An 8 cm x 1.5 cm keloid was removed sharply with knife blade under 4.0 loupe magnification. There was a large amount of devitalized tissue underneath this area and retained sutures.  I removed all of this.  He had a large amount of scar tissue and adherence of the skin and subcu against musculature.  At this time, I performed a biceps and brachialis muscle tenolysis, tenosynovectomy at the musculotendinous region.  This was done very carefully and cautiously.  Following this, I then very carefully and cautiously performed tracking of the nerve from distal to proximal.  I placed a vessel loop around the median nerve and then very carefully dissected from distal to proximal.  At the portion of the procedure where the nerve was scarred in  against the brachial artery, Dr. Durene Cal of Vascular Surgery scrubbed in to evaluate.  His repair was noted to be patent.  I very carefully teased the nerve away from the scar tissue inside of his prior repair.  This was done without complicating feature, and there was no significant disruption beneath the epineurium, but a large amount of scar tissue and compression. After the thorough decompression, I took intraoperative photos for documentation and then irrigated copiously.  The nerve was intact.  It was certainly compressed with scar tissue  and a neurolysis and decompression was done without difficulty.  Following this, we very carefully irrigated the area followed by closure of the wound with Prolene.  I used a vertical and horizontal mattress combination and closed the wound.  I felt that the cosmetic appearance will be much better, will begin nerve glides immediately, and I want to see the patient back in the office next week.  These notes have been discussed and all questions have been encouraged and answered.  It was pleasure to see him.  Soft dressing was applied.  There were no complications.  These notes discussed.  All questions encouraged and discharged him home on Keflex as well as OxyIR p.r.n. pain.     Dionne Ano. Amanda Pea, M.D.     [facility]  D:  11/19/2013  T:  11/20/2013  Job:  [id number]

## 2014-01-01 IMAGING — CR DG CHEST 2V
2 series · 2 of 2 positions shown · non-contrast
Comparison: February 19, 2007.

CLINICAL DATA: Hypertension

CHEST - 2 VIEW

[view not recorded (1 of 2)]
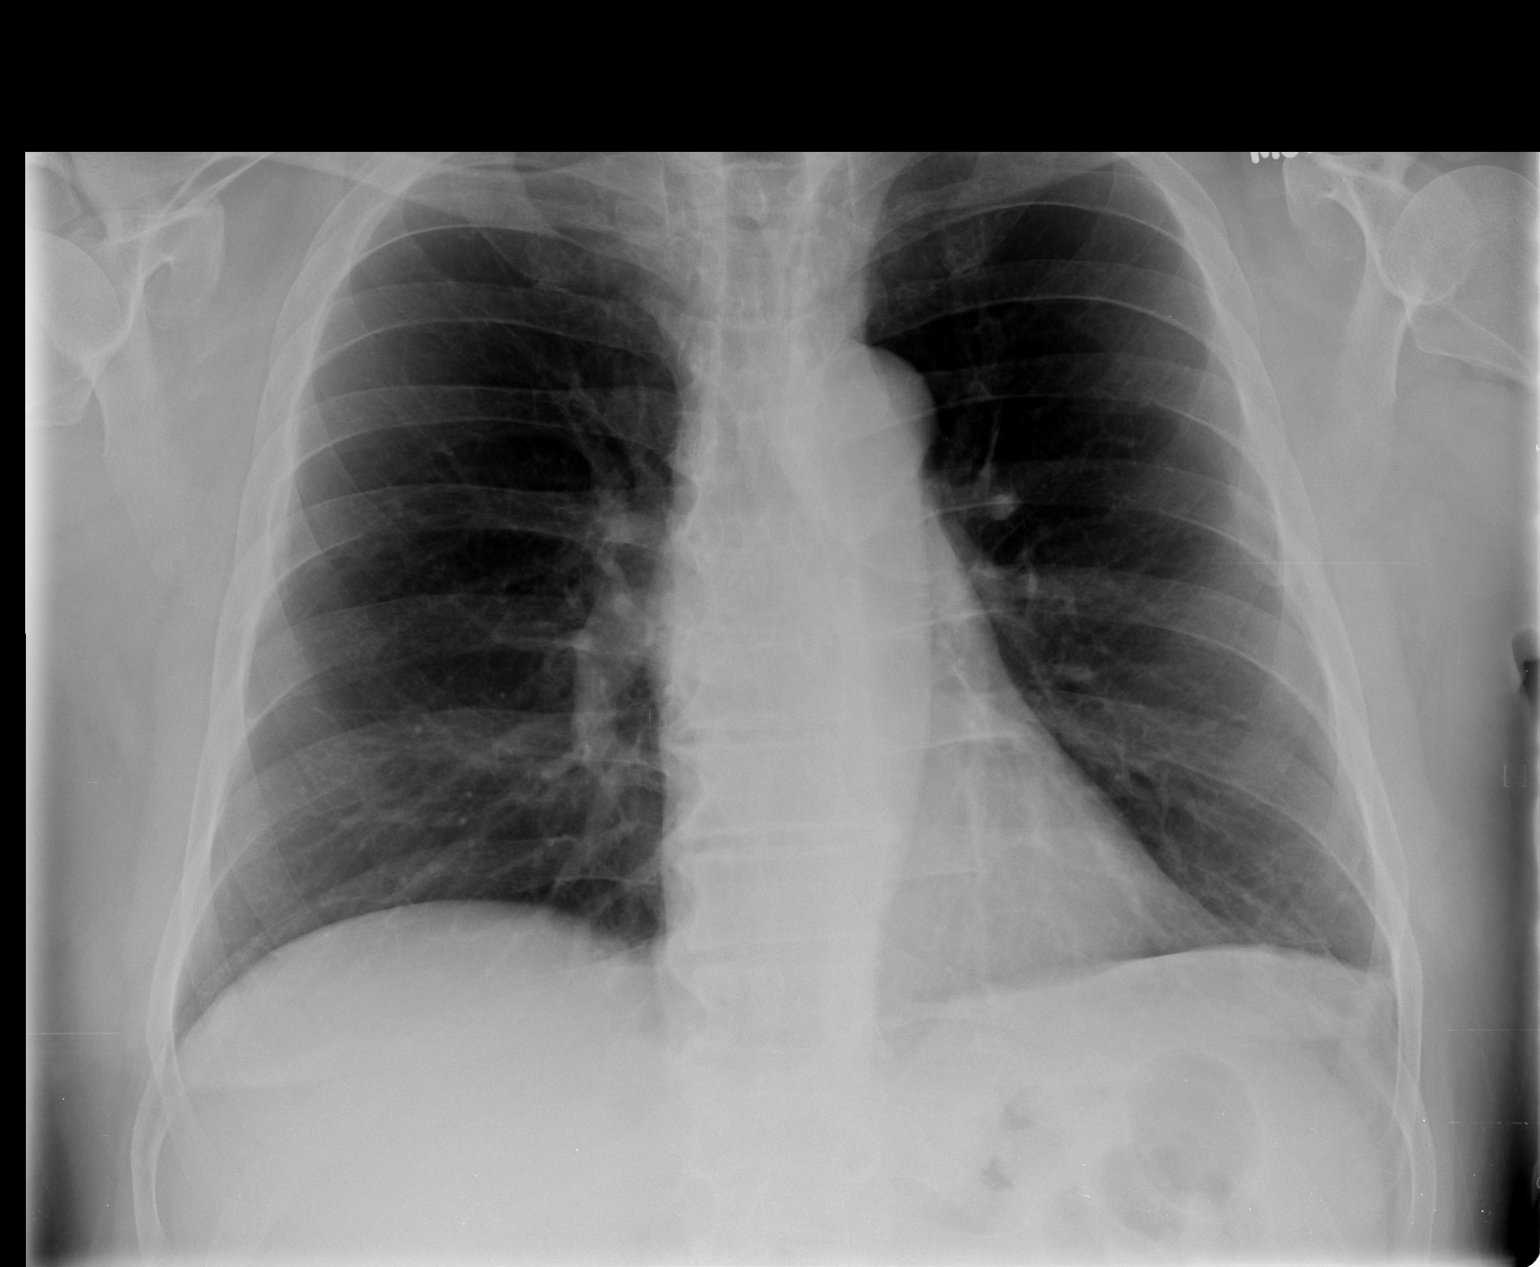

[view not recorded (2 of 2)]
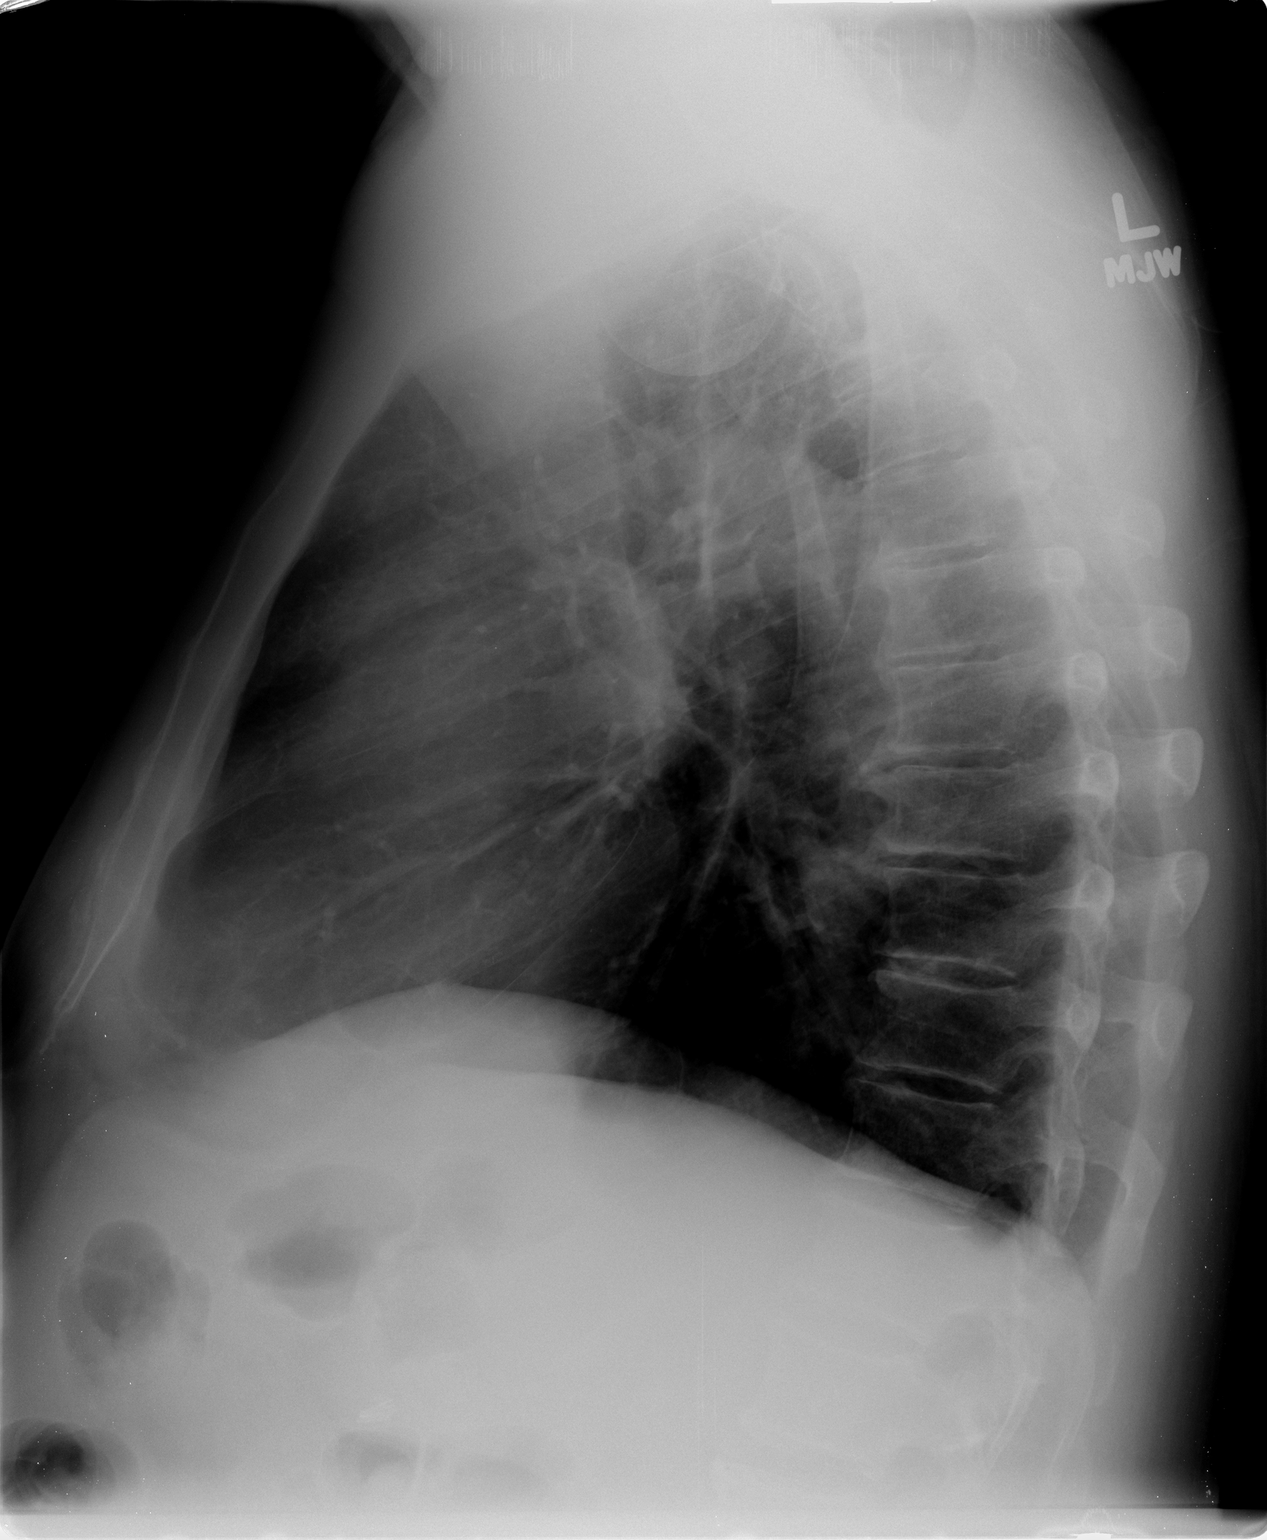

[2 of 2 positions shown; findings below may reference images not displayed]

FINDINGS: Cardiomediastinal silhouette appears normal.  No acute
pulmonary disease is noted.  Bony thorax is intact.
IMPRESSION: No acute cardiopulmonary abnormality seen.

## 2014-01-15 IMAGING — RF DG LUMBAR SPINE 2-3V
1 series · 3 of 3 positions shown · non-contrast
Comparison: 08/19/2012 MR.

CLINICAL DATA: Revision L4-5 fusion.

DG C-ARM 1-60 MIN,LUMBAR SPINE - 2-3 VIEW
Fluoroscopic time: 25 seconds.

[Series 1: run · 3 of 3 slices shown]
[im 1/3]
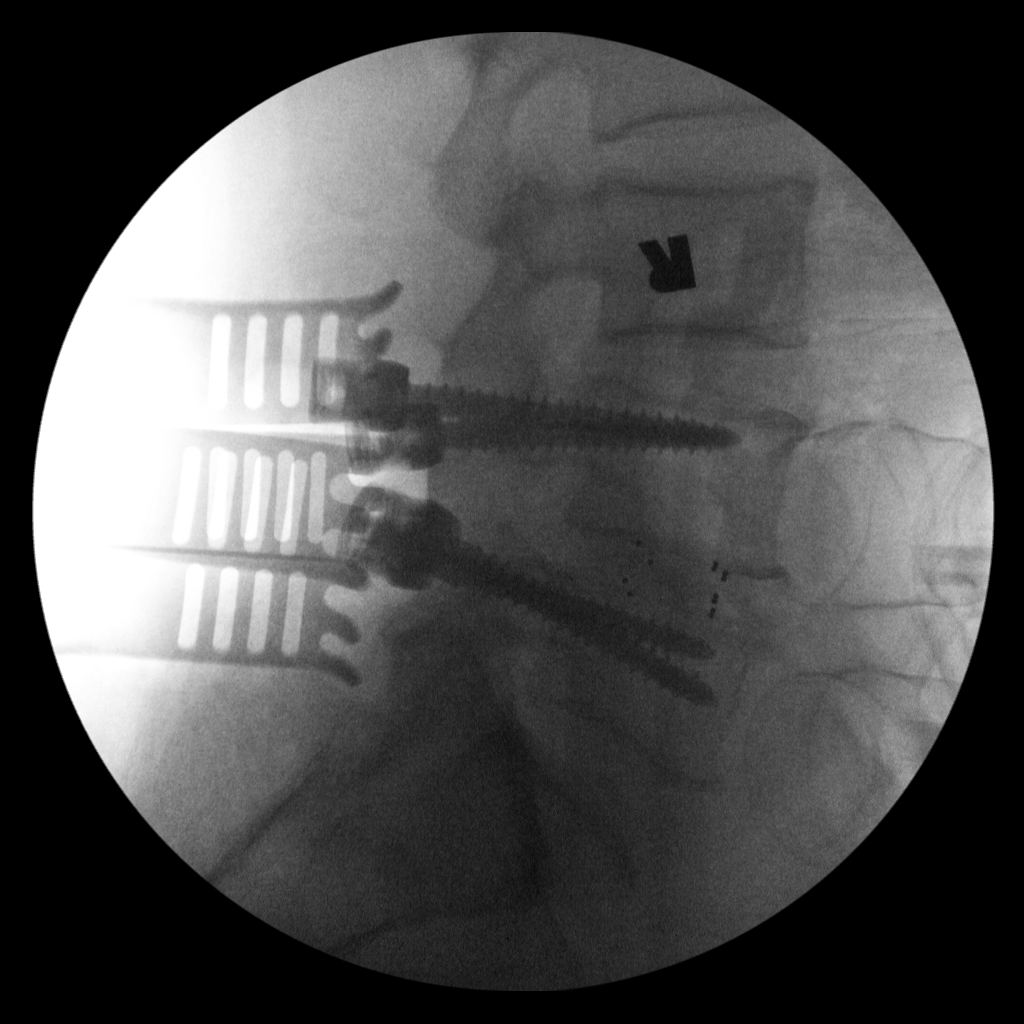
[im 2/3]
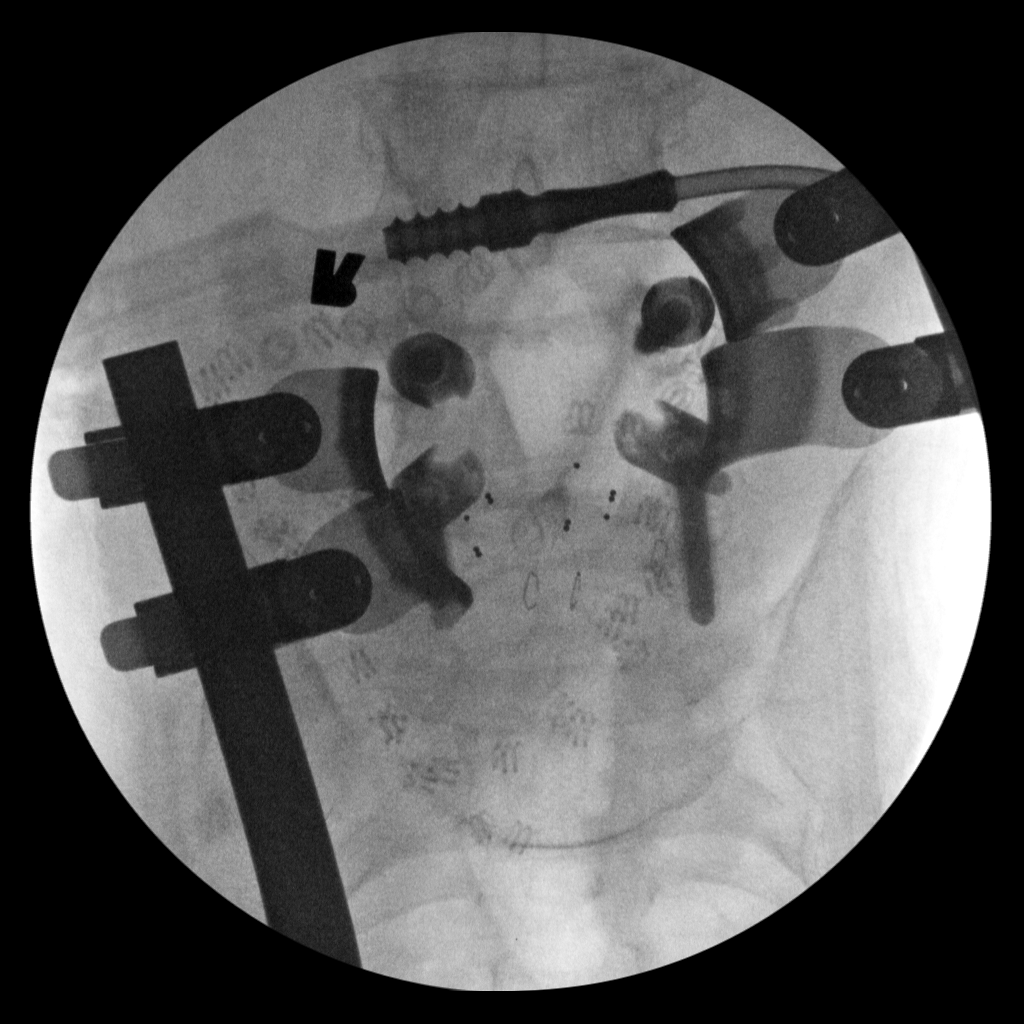
[im 3/3]
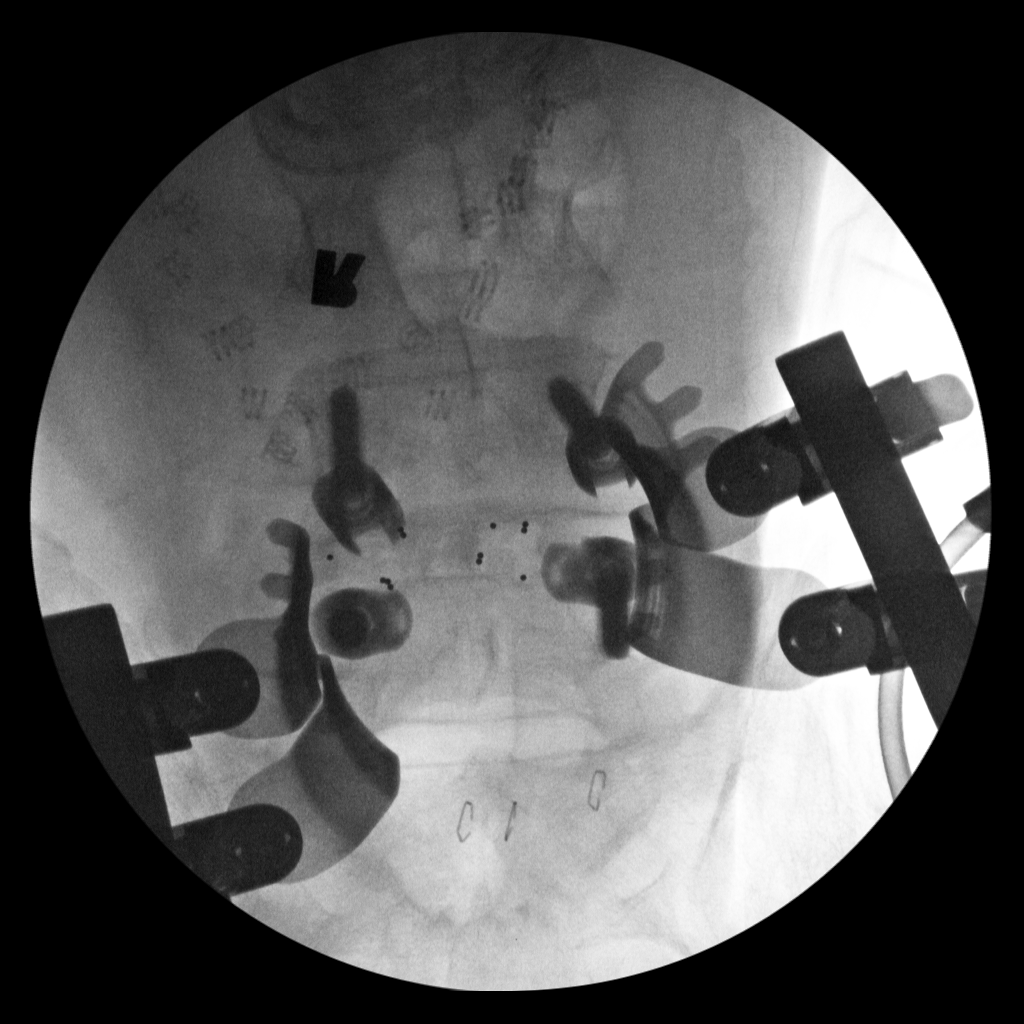

[3 of 3 positions shown; findings below may reference images not displayed]

FINDINGS: Three intraoperative C-arm views submitted for review
after surgery.

L4 and L5 pedicle screws are in place.  Interbody spacer.  No
complication noted.  Complete fusion can be evaluated on follow-up
two-view exam.
IMPRESSION: L4-5 fusion.

## 2014-01-29 ENCOUNTER — Encounter: Payer: Self-pay | Admitting: Surgery

## 2014-02-01 ENCOUNTER — Encounter: Payer: Self-pay | Admitting: Surgery

## 2014-02-01 ENCOUNTER — Ambulatory Visit (HOSPITAL_COMMUNITY)
Admission: RE | Admit: 2014-02-01 | Discharge: 2014-02-01 | Disposition: A | Discharge status: Home / Self Care | Payer: Worker's Compensation | Source: Ambulatory Visit | Attending: Surgery | Admitting: Surgery

## 2014-02-01 ENCOUNTER — Ambulatory Visit (INDEPENDENT_AMBULATORY_CARE_PROVIDER_SITE_OTHER): Payer: Worker's Compensation | Admitting: Surgery

## 2014-02-01 VITALS — BP 148/88 | HR 54 | Ht 70.0 in | Wt 243.3 lb

## 2014-02-01 DIAGNOSIS — S159XXS Injury of unspecified blood vessel at neck level, sequela: Principal | ICD-10-CM

## 2014-02-01 DIAGNOSIS — S85909S Unspecified injury of unspecified blood vessel at lower leg level, unspecified leg, sequela: Secondary | ICD-10-CM

## 2014-02-01 DIAGNOSIS — S75909S Unspecified injury of unspecified blood vessel at hip and thigh level, unspecified leg, sequela: Principal | ICD-10-CM

## 2014-02-01 DIAGNOSIS — S45112S Laceration of brachial artery, left side, sequela: Secondary | ICD-10-CM

## 2014-02-01 DIAGNOSIS — S55909S Unspecified injury of unspecified blood vessel at forearm level, unspecified arm, sequela: Secondary | ICD-10-CM

## 2014-02-01 DIAGNOSIS — S090XXS Injury of blood vessels of head, not elsewhere classified, sequela: Secondary | ICD-10-CM | POA: Insufficient documentation

## 2014-02-01 DIAGNOSIS — S45119A Laceration of brachial artery, unspecified side, initial encounter: Secondary | ICD-10-CM | POA: Insufficient documentation

## 2014-02-01 DIAGNOSIS — S45909S Unspecified injury of unspecified blood vessel at shoulder and upper arm level, unspecified arm, sequela: Secondary | ICD-10-CM

## 2014-02-01 DIAGNOSIS — S95909S Unspecified injury of unspecified blood vessel at ankle and foot level, unspecified leg, sequela: Principal | ICD-10-CM | POA: Insufficient documentation

## 2014-02-01 DIAGNOSIS — X58XXXA Exposure to other specified factors, initial encounter: Secondary | ICD-10-CM | POA: Insufficient documentation

## 2014-02-01 DIAGNOSIS — Z48812 Encounter for surgical aftercare following surgery on the circulatory system: Secondary | ICD-10-CM

## 2014-02-01 NOTE — Addendum Note (Signed)
Addended by: Sharee Pimple on: 02/01/2014 05:44 PM   Modules accepted: Orders

## 2014-02-01 NOTE — Progress Notes (Signed)
HISTORY AND PHYSICAL     CC:  F/u for surgery  Joshua Nielsen*  HPI: This is a 58 y.o. male who underwent left brachial artery repair following traumatic injury on 10/15/13.  He states that he still has numbness of his hand.  He is on light duty at work.  On November 20, 2013, he underwent: 1. Excision of a large keloid deformity about the arm with retained  sutures within the keloid mass, this was an 8-cm section.  2. Tenolysis, tenosynovectomy, brachialis and biceps musculotendinous  juncture.  3. Median nerve exploration and decompression with neurolysis.  4. Intraoperative confirmation of brachial artery patency.    Past Medical History  Diagnosis Date  . PONV (postoperative nausea and vomiting)   . Arthritis     "knees" (09/19/2012)  . Chronic back pain     "neck to lower back" (09/19/2012)  . Anxiety     "white coat syndrome" (09/19/2012)  . Type II diabetes mellitus 05/2012  . Hypertension   . Hypothyroidism   . Complication of anesthesia     "trouble getting BM after back OR"  . OSA on CPAP   . Type II diabetes mellitus   . History of blood transfusion 10/15/2013  . Arthritis     "back and legs" (10/15/2013)   Past Surgical History  Procedure Laterality Date  . Knee arthroscopy Bilateral   . Excisional hemorrhoidectomy  2005  . Anterior cervical decomp/discectomy fusion  2008  . Biceps tendon repair Right 2010  . Appendectomy  1981  . Cholecystectomy  2010  . Hernia repair  2012    "incisional; had to also go in around my navel" (09/19/2012)  . Colonoscopy  2011  . Posterior lumbar fusion  09/19/2012  . Back surgery    . I&d extremity Left 10/15/2013    upper arm w/removal of foreign body  . Artery repair Left 10/15/2013    brachial  . Posterior lumbar fusion  ~ 2012    "put rods in"  . Anterior cervical decomp/discectomy fusion  2009  . Hernia repair  ~ 2007    umbilical  . Cholecystectomy  ~ 2007  . Knee arthroscopy Right X 2  . Knee arthroscopy Left X 1  .  Varicose vein surgery Left 1980's  . Wound exploration Left 10/15/2013    Procedure: WOUND EXPLORATION - LEFT ARM OPERATIVE SITE;  Surgeon: Sheral Apley, MD;  Location: [facility] OR;  Service: Orthopedics;  Laterality: Left;  . I&d extremity Left 10/15/2013    Procedure: IRRIGATION AND DEBRIDEMENT EXTREMITY ;  Surgeon: Sheral Apley, MD;  Location: [facility] OR;  Service: Orthopedics;  Laterality: Left;  . Artery repair Left 10/15/2013    Procedure: PRIMARY BRACHIAL ARTERY REPAIR;  Surgeon: Nada Libman, MD;  Location: [facility] OR;  Service: Vascular;  Laterality: Left;  . Nerve and artery repair Left 11/19/2013    Procedure: LEFT ARM IRRIGATION AND DEBRIDEMENT; SCAR REVISION; MEDIAN NERVE TENOLYSIS AND NEUROLYSIS; ARTERY EXPLORATION;  Surgeon: Dominica Severin, MD;  Location: [facility] OR;  Service: Orthopedics;  Laterality: Left;    Allergies  Allergen Reactions  . Bee Venom Anaphylaxis  . Levaquin [Levofloxacin] Other (See Comments)    Severe joint pain  . Levofloxacin Other (See Comments)    Severe joint pain  . Demerol [Meperidine] Itching and Rash    Rash is severe    Current Outpatient Prescriptions  Medication Sig Dispense Refill  . amLODipine-benazepril (LOTREL) 5-10 MG per capsule Take 1 capsule by  mouth daily.      . Ascorbic Acid (VITAMIN C) 1000 MG tablet Take 1,000 mg by mouth daily.      Marland Kitchen aspirin EC 81 MG EC tablet Take 1 tablet (81 mg total) by mouth daily.      . cephALEXin (KEFLEX) 500 MG capsule Take 1 capsule (500 mg total) by mouth 4 (four) times daily.  40 capsule  0  . docusate sodium 100 MG CAPS Take 100 mg by mouth 2 (two) times daily.  60 capsule  0  . EPINEPHrine (EPIPEN) 0.3 mg/0.3 mL IJ SOAJ injection Inject 0.3 mg into the muscle once.      Marland Kitchen levothyroxine (SYNTHROID, LEVOTHROID) 50 MCG tablet Take 50 mcg by mouth daily before breakfast.      . metFORMIN (GLUCOPHAGE) 500 MG tablet Take 500 mg by mouth daily with breakfast.      . metoprolol tartrate (LOPRESSOR) 25 MG tablet Take  25 mg by mouth daily.      . pregabalin (LYRICA) 75 MG capsule Take 1 capsule (75 mg total) by mouth 2 (two) times daily.  60 capsule  0  . Pyridoxine HCl (VITAMIN B-6 PO) Take 1,000 mg by mouth daily.      . valsartan-hydrochlorothiazide (DIOVAN-HCT) 320-25 MG per tablet Take 1 tablet by mouth every morning.       Marland Kitchen oxyCODONE (OXY IR/ROXICODONE) 5 MG immediate release tablet Take 2 tablets (10 mg total) by mouth every 4 (four) hours as needed for severe pain.  50 tablet  0  . oxyCODONE 10 MG TABS Take 1-2 tablets (10-20 mg total) by mouth every 4 (four) hours as needed (  for mild pain,  for moderate pain,  for severe pain).  50 tablet  0   No current facility-administered medications for this visit.    History reviewed. No pertinent family history.  History   Social History  . Marital Status: Married    Spouse Name: N/A    Number of Children: N/A  . Years of Education: N/A   Occupational History  . Not on file.   Social History Main Topics  . Smoking status: Never Smoker   . Smokeless tobacco: Former Neurosurgeon     Comment: "quit chewing in the 1980's"  . Alcohol Use: No  . Drug Use: No  . Sexual Activity: Yes   Other Topics Concern  . Not on file   Social History Narrative   ** Merged History Encounter **         ROS:  Positive   [x ] Negative    All sytems reviewed and are negative  Cardiovascular:  chest pain/pressure  palpitations  SOB lying flat  DOE  pain in legs while walking  pain in feet when lying flat  hx of DVT  hx of phlebitis  swelling in legs  varicose veins  Pulmonary:  productive cough  asthma  wheezing  Neurologic:  weakness in  arms  legs  numbness in  arms  legs difficulty speaking or slurred speech  temporary loss of vision in one eye  dizziness  Hematologic:  bleeding problems  problems with blood clotting easily  GI  vomiting blood  blood in stool  GU:   burning with urination  blood in urine  Psychiatric:  hx of major depression  Integumentary:  rashes  ulcers  Constitutional:  fever  chills   PHYSICAL EXAMINATION:  Filed Vitals:   02/01/14 1531  BP: 148/88  Pulse: 54   Body  mass index is 34.91 kg/(m^2).  General:  WDWN in NAD Gait: Not observed HENT: WNL, normocephalic Eyes: Pupils equal Pulmonary: normal non-labored breathing , without Rales, rhonchi,  wheezing Cardiac: RRR Skin: without rashes, without .  Well healed scar left upper arm Vascular Exam/Pulses: 3+ left radial pulse and 5/5 motor left hand Extremities: without ischemic changes, without Gangrene , without cellulitis; without open wounds;  Musculoskeletal: no muscle wasting or atrophy  Neurologic: A&O X 3; Appropriate Affect ; SENSATION: normal; MOTOR FUNCTION:  moving all extremities equally. Speech is fluent/normal   Non-Invasive Vascular Imaging:  Upper extremity repair following traumatic injury 02/01/14  -Widely patent repair of the brachial artery with a well visualized anastomosis  Pt meds includes: Statin:  No. Beta Blocker:  Yes.   Aspirin:  Yes.   ACEI:  No. ARB:  Yes.   Other Antiplatelet/Anticoagulant:  No.   ASSESSMENT/PLAN:: 58 y.o. male who underwent left brachial artery repair following traumatic injury 10/15/13   -pt is doing well from surgical standpoint.  States he still does not have much feeling in his left hand, however, he is still able to function with the left hand.  He did undergo exploration of wound again in July 2015 by Dr. Amanda Pea.  Pt states he told him he would have an 80% chance of having the sensation return over the next year. -will have pt follow up in one year with arterial duplex of left arm.   -Continue baby aspirin -avoid blood pressures in left arm if possible per Dr. Vista Lawman, PA-C [facility] [phone]  Clinic MD:  Pt seen and examined in conjunction  with Dr. Myra Gianotti   I agree with the above.  I have seen and examined the patient.  His wounds have completely healed.  He has a palpable left radial pulse.  Ultrasound today shows no significant velocity elevations around the brachial artery repair.  I have encouraged the patient to continue taking a baby aspirin, and definitely.  He'll followup in one year with a surveillance ultrasound.  Durene Cal

## 2015-02-02 ENCOUNTER — Encounter: Payer: Self-pay | Admitting: Surgery

## 2015-02-07 ENCOUNTER — Ambulatory Visit (HOSPITAL_COMMUNITY)
Admission: RE | Admit: 2015-02-07 | Discharge: 2015-02-07 | Disposition: A | Discharge status: Home / Self Care | Payer: Worker's Compensation | Source: Ambulatory Visit | Attending: Surgery | Admitting: Surgery

## 2015-02-07 ENCOUNTER — Ambulatory Visit (INDEPENDENT_AMBULATORY_CARE_PROVIDER_SITE_OTHER): Payer: Worker's Compensation | Admitting: Surgery

## 2015-02-07 ENCOUNTER — Encounter: Payer: Self-pay | Admitting: Surgery

## 2015-02-07 VITALS — BP 130/82 | HR 46 | Ht 70.0 in | Wt 235.0 lb

## 2015-02-07 DIAGNOSIS — Z48812 Encounter for surgical aftercare following surgery on the circulatory system: Secondary | ICD-10-CM

## 2015-02-07 DIAGNOSIS — S45112S Laceration of brachial artery, left side, sequela: Secondary | ICD-10-CM

## 2015-02-07 DIAGNOSIS — S45112D Laceration of brachial artery, left side, subsequent encounter: Secondary | ICD-10-CM | POA: Insufficient documentation

## 2015-02-07 DIAGNOSIS — X58XXXD Exposure to other specified factors, subsequent encounter: Secondary | ICD-10-CM | POA: Insufficient documentation

## 2015-02-07 NOTE — Progress Notes (Signed)
Patient name: Joshua Nielsen MRN: [id number] DOB: 10-04-1955 Sex: male     Chief Complaint  Patient presents with  . Re-evaluation    1 year f/u - c/o numbness L UE same since last OV    HISTORY OF PRESENT ILLNESS:  the patient is back for follow-up.  He initially presented on 10/15/2013 with trauma to his left arm while at work.  He was taken to the operating room performed body removal by Dr. Eulah Pont.  He developed recurrent bleeding and therefore I went in and evaluated the artery.  He had a 90% transection of the brachial artery which was  repaired primarily.  Postoperatively he was having worsening neuropathic issues and therefore I had him evaluated by Dr. Butler Denmark.  He reexplored the wound and did not find any correctable issues to the median nerve.  The patient continues to have some neuropathic pain especially with light touch to his left arm.  He is back at work.  He has good grip strength.  Past Medical History  Diagnosis Date  . PONV (postoperative nausea and vomiting)   . Arthritis     "knees" (09/19/2012)  . Chronic back pain     "neck to lower back" (09/19/2012)  . Anxiety     "white coat syndrome" (09/19/2012)  . Type II diabetes mellitus 05/2012  . Hypertension   . Hypothyroidism   . Complication of anesthesia     "trouble getting BM after back OR"  . OSA on CPAP   . Type II diabetes mellitus   . History of blood transfusion 10/15/2013  . Arthritis     "back and legs" (10/15/2013)    Past Surgical History  Procedure Laterality Date  . Knee arthroscopy Bilateral   . Excisional hemorrhoidectomy  2005  . Anterior cervical decomp/discectomy fusion  2008  . Biceps tendon repair Right 2010  . Appendectomy  1981  . Cholecystectomy  2010  . Hernia repair  2012    "incisional; had to also go in around my navel" (09/19/2012)  . Colonoscopy  2011  . Posterior lumbar fusion  09/19/2012  . Back surgery    . I&d extremity Left 10/15/2013    upper arm w/removal of foreign body    . Artery repair Left 10/15/2013    brachial  . Posterior lumbar fusion  ~ 2012    "put rods in"  . Anterior cervical decomp/discectomy fusion  2009  . Hernia repair  ~ 2007    umbilical  . Cholecystectomy  ~ 2007  . Knee arthroscopy Right X 2  . Knee arthroscopy Left X 1  . Varicose vein surgery Left 1980's  . Wound exploration Left 10/15/2013    Procedure: WOUND EXPLORATION - LEFT ARM OPERATIVE SITE;  Surgeon: Sheral Apley, MD;  Location: [facility] OR;  Service: Orthopedics;  Laterality: Left;  . I&d extremity Left 10/15/2013    Procedure: IRRIGATION AND DEBRIDEMENT EXTREMITY ;  Surgeon: Sheral Apley, MD;  Location: [facility] OR;  Service: Orthopedics;  Laterality: Left;  . Artery repair Left 10/15/2013    Procedure: PRIMARY BRACHIAL ARTERY REPAIR;  Surgeon: Nada Libman, MD;  Location: [facility] OR;  Service: Vascular;  Laterality: Left;  . Nerve and artery repair Left 11/19/2013    Procedure: LEFT ARM IRRIGATION AND DEBRIDEMENT; SCAR REVISION; MEDIAN NERVE TENOLYSIS AND NEUROLYSIS; ARTERY EXPLORATION;  Surgeon: Dominica Severin, MD;  Location: [facility] OR;  Service: Orthopedics;  Laterality: Left;    Social History   Social  History  . Marital Status: Married    Spouse Name: N/A  . Number of Children: N/A  . Years of Education: N/A   Occupational History  . Not on file.   Social History Main Topics  . Smoking status: Never Smoker   . Smokeless tobacco: Former Neurosurgeon     Comment: "quit chewing in the 1980's"  . Alcohol Use: No  . Drug Use: No  . Sexual Activity: Yes   Other Topics Concern  . Not on file   Social History Narrative   ** Merged History Encounter **        No family history on file.  Allergies as of 02/07/2015 - Review Complete 02/07/2015  Allergen Reaction Noted  . Bee venom Anaphylaxis 11/09/2013  . Levaquin [levofloxacin] Other (See Comments) 10/15/2013  . Levofloxacin Other (See Comments) 09/04/2012  . Demerol [meperidine] Itching and Rash 03/14/2012    Current  Outpatient Prescriptions on File Prior to Visit  Medication Sig Dispense Refill  . amLODipine-benazepril (LOTREL) 5-10 MG per capsule Take 1 capsule by mouth daily.    Marland Kitchen aspirin EC 81 MG EC tablet Take 1 tablet (81 mg total) by mouth daily.    Marland Kitchen EPINEPHrine (EPIPEN) 0.3 mg/0.3 mL IJ SOAJ injection Inject 0.3 mg into the muscle once.    Marland Kitchen levothyroxine (SYNTHROID, LEVOTHROID) 50 MCG tablet Take 50 mcg by mouth daily before breakfast.    . metFORMIN (GLUCOPHAGE) 500 MG tablet Take 500 mg by mouth daily with breakfast.    . metoprolol tartrate (LOPRESSOR) 25 MG tablet Take 25 mg by mouth daily.    . Ascorbic Acid (VITAMIN C) 1000 MG tablet Take 1,000 mg by mouth daily.    . cephALEXin (KEFLEX) 500 MG capsule Take 1 capsule (500 mg total) by mouth 4 (four) times daily. (Patient not taking: Reported on 02/07/2015) 40 capsule 0  . docusate sodium 100 MG CAPS Take 100 mg by mouth 2 (two) times daily. (Patient not taking: Reported on 02/07/2015) 60 capsule 0  . oxyCODONE (OXY IR/ROXICODONE) 5 MG immediate release tablet Take 2 tablets (10 mg total) by mouth every 4 (four) hours as needed for severe pain. (Patient not taking: Reported on 02/07/2015) 50 tablet 0  . oxyCODONE 10 MG TABS Take 1-2 tablets (10-20 mg total) by mouth every 4 (four) hours as needed (  for mild pain,  for moderate pain,  for severe pain). (Patient not taking: Reported on 02/07/2015) 50 tablet 0  . pregabalin (LYRICA) 75 MG capsule Take 1 capsule (75 mg total) by mouth 2 (two) times daily. (Patient not taking: Reported on 02/07/2015) 60 capsule 0  . Pyridoxine HCl (VITAMIN B-6 PO) Take 1,000 mg by mouth daily.    . valsartan-hydrochlorothiazide (DIOVAN-HCT) 320-25 MG per tablet Take 1 tablet by mouth every morning.      No current facility-administered medications on file prior to visit.     REVIEW OF SYSTEMS:  see history of present illness, otherwise negative  PHYSICAL EXAMINATION:   Vital signs are  Filed Vitals:    02/07/15 1618  BP: 130/82  Pulse: 46  Height:  (1.778 m)  Weight: 235 lb (106.595 kg)  SpO2: 100%   Body mass index is 33.72 kg/(m^2). General: The patient appears their stated age. HEENT:  No gross abnormalities Pulmonary:  Non labored breathing Musculoskeletal: There are no major deformities. Neurologic:  Paresthesias with light touch to the left hand Skin: There are no ulcer or rashes noted. Psychiatric: The patient has normal  affect. Cardiovascular:  Palpable left radial pulse  Diagnostic Studies  duplex evaluation of the brachial artery was performed today. There is minimal plaquing at the arteriotomy repair site.  He had triphasic waveforms.  Assessment:  status post repair of left brachial artery injury secondary to trauma Plan:  I discussed the importance of annual surveillance to make sure that he doesn't develop a stenosis at the arteriotomy repair site over time.  He has mild disease currently.  We also discussed checking his radial pulse if he develops any symptoms in his left arm. He will follow-up again in 1 year.  Jorge Ny, M.D. [facility] [place] Office: [phone] Pager:  [phone]

## 2015-02-09 NOTE — Addendum Note (Signed)
Addended by: Adria Dill L on: 02/09/2015 09:07 AM   Modules accepted: Orders

## 2015-06-07 ENCOUNTER — Telehealth: Payer: Self-pay

## 2015-06-07 DIAGNOSIS — S45192D Other specified injury of brachial artery, left side, subsequent encounter: Secondary | ICD-10-CM

## 2015-06-07 DIAGNOSIS — M79602 Pain in left arm: Secondary | ICD-10-CM

## 2015-06-07 NOTE — Telephone Encounter (Signed)
Phone call from pt.  Reported he has had increased aching in left upper arm @ site of previous injury and repair of brachial artery, x 1 week.  Stated the aching feels like a toothache, and throbs at times.  Denied any swelling in the left arm.  Stated he has had numbness and decreased strength in the left arm since the injury.  Was directed to call and make an appt. With Dr. Myra Gianotti per the [employer]. Representative.   Discussed with Dr. Arbie Cookey.  Recommended to schedule left upper extremity arterial duplex with finger pressures.

## 2015-06-08 ENCOUNTER — Encounter: Payer: Self-pay | Admitting: Family

## 2015-06-08 NOTE — Telephone Encounter (Signed)
Spoke with Joshua Nielsen to schedule his appointment, dpm

## 2015-06-13 ENCOUNTER — Ambulatory Visit (INDEPENDENT_AMBULATORY_CARE_PROVIDER_SITE_OTHER): Payer: Worker's Compensation | Admitting: Family

## 2015-06-13 ENCOUNTER — Ambulatory Visit (HOSPITAL_COMMUNITY)
Admission: RE | Admit: 2015-06-13 | Discharge: 2015-06-13 | Disposition: A | Discharge status: Home / Self Care | Payer: Worker's Compensation | Source: Ambulatory Visit | Attending: Family | Admitting: Family

## 2015-06-13 ENCOUNTER — Encounter: Payer: Self-pay | Admitting: Family

## 2015-06-13 VITALS — BP 150/91 | HR 50 | Temp 97.8°F | Ht 70.0 in | Wt 239.4 lb

## 2015-06-13 DIAGNOSIS — E119 Type 2 diabetes mellitus without complications: Secondary | ICD-10-CM | POA: Insufficient documentation

## 2015-06-13 DIAGNOSIS — I10 Essential (primary) hypertension: Secondary | ICD-10-CM | POA: Diagnosis not present

## 2015-06-13 DIAGNOSIS — X58XXXD Exposure to other specified factors, subsequent encounter: Secondary | ICD-10-CM | POA: Diagnosis not present

## 2015-06-13 DIAGNOSIS — M79602 Pain in left arm: Secondary | ICD-10-CM

## 2015-06-13 DIAGNOSIS — S45112S Laceration of brachial artery, left side, sequela: Secondary | ICD-10-CM | POA: Diagnosis not present

## 2015-06-13 DIAGNOSIS — S45192D Other specified injury of brachial artery, left side, subsequent encounter: Secondary | ICD-10-CM

## 2015-06-13 NOTE — Progress Notes (Signed)
History of Present Illness  Joshua Nielsen is a 60 y.o. ([birth year]) male patient of Dr. Myra Gianotti who initially presented on 10/15/2013 with trauma to his left arm while at work. He was taken to the operating room performed foreign body removal by Dr. Eulah Pont. He developed recurrent bleeding and therefore Dr. Myra Gianotti went in and evaluated the artery. 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 Postoperatively he was having worsening neuropathic issues and therefore Dr. Myra Gianotti had him evaluated by Dr. Butler Denmark. He reexplored the wound and did not find any correctable issues to the median nerve.  The patient continues to have some neuropathic pain especially with light touch to his left arm. He is back at work. He has good grip strength.  He returns today with c/o left upper arm pain that started 6 days ago after using his arms to do more strenuous work than usual over the previous 2 days. The pain in the upper left arm has gotten progressively better. He states his left hand and forearm have the same baseline numbness.  Dr. Myra Gianotti last saw pt on 02/07/15. At that time there was minimal plaquing at the arteriotomy repair site. He had triphasic waveforms. Dr. Myra Gianotti discussed the importance of annual surveillance to make sure that he doesn't develop a stenosis at the arteriotomy repair site over time. He had mild disease at that time Dr. Myra Gianotti also discussed checking pt's radial pulse if he develops any symptoms in his left arm. He was to follow-up again in 1 year.    Past Medical History  Diagnosis Date  . PONV (postoperative nausea and vomiting)   . Arthritis     "knees" (09/19/2012)  . Chronic back pain     "neck to lower back" (09/19/2012)  . Anxiety     "white coat syndrome" (09/19/2012)  . Type II diabetes mellitus (HCC) 05/2012  . Hypertension   . Hypothyroidism   . Complication of anesthesia     "trouble getting BM after back OR"  . OSA  on CPAP   . Type II diabetes mellitus (HCC)   . History of blood transfusion 10/15/2013  . Arthritis     "back and legs" (10/15/2013)    Social History Social History  Substance Use Topics  . Smoking status: Never Smoker   . Smokeless tobacco: Former Neurosurgeon     Comment: "quit chewing in the 1980's"  . Alcohol Use: No    Family History Family History  Problem Relation Age of Onset  . Heart disease Mother     before age 35  . Heart disease Father     before age 86  . Heart disease Sister     before age 14    Surgical History Past Surgical History  Procedure Laterality Date  . Knee arthroscopy Bilateral   . Excisional hemorrhoidectomy  2005  . Anterior cervical decomp/discectomy fusion  2008  . Biceps tendon repair Right 2010  . Appendectomy  1981  . Cholecystectomy  2010  . Hernia repair  2012    "incisional; had to also go in around my navel" (09/19/2012)  . Colonoscopy  2011  . Posterior lumbar fusion  09/19/2012  . Back surgery    . I&d extremity Left 10/15/2013    upper arm w/removal of foreign body  . Artery repair Left 10/15/2013    brachial  . Posterior lumbar fusion  ~ 2012    "put rods in"  .  Anterior cervical decomp/discectomy fusion  2009  . Hernia repair  ~ 2007    umbilical  . Cholecystectomy  ~ 2007  . Knee arthroscopy Right X 2  . Knee arthroscopy Left X 1  . Varicose vein surgery Left 1980's  . Wound exploration Left 10/15/2013    Procedure: WOUND EXPLORATION - LEFT ARM OPERATIVE SITE;  Surgeon: Sheral Apley, MD;  Location: [facility] OR;  Service: Orthopedics;  Laterality: Left;  . I&d extremity Left 10/15/2013    Procedure: IRRIGATION AND DEBRIDEMENT EXTREMITY ;  Surgeon: Sheral Apley, MD;  Location: [facility] OR;  Service: Orthopedics;  Laterality: Left;  . Artery repair Left 10/15/2013    Procedure: PRIMARY BRACHIAL ARTERY REPAIR;  Surgeon: Nada Libman, MD;  Location: [facility] OR;  Service: Vascular;  Laterality: Left;  . Nerve and artery repair Left 11/19/2013     Procedure: LEFT ARM IRRIGATION AND DEBRIDEMENT; SCAR REVISION; MEDIAN NERVE TENOLYSIS AND NEUROLYSIS; ARTERY EXPLORATION;  Surgeon: Dominica Severin, MD;  Location: [facility] OR;  Service: Orthopedics;  Laterality: Left;    Allergies  Allergen Reactions  . Bee Venom Anaphylaxis  . Levaquin [Levofloxacin] Other (See Comments)    Severe joint pain  . Levofloxacin Other (See Comments)    Severe joint pain  . Demerol [Meperidine] Itching and Rash    Rash is severe    Current Outpatient Prescriptions  Medication Sig Dispense Refill  . amLODipine-benazepril (LOTREL) 5-10 MG per capsule Take 1 capsule by mouth daily.    Marland Kitchen aspirin EC 81 MG EC tablet Take 1 tablet (81 mg total) by mouth daily.    . clarithromycin (BIAXIN) 500 MG tablet Take 500 mg by mouth 2 (two) times daily.    Marland Kitchen EPINEPHrine (EPIPEN) 0.3 mg/0.3 mL IJ SOAJ injection Inject 0.3 mg into the muscle once.    Marland Kitchen glipiZIDE (GLUCOTROL XL) 2.5 MG 24 hr tablet Take 2.5 mg by mouth daily with breakfast.    . HYDROcodone-homatropine (HYCODAN) 5-1.5 MG/5ML syrup Take 5 mLs by mouth every 6 (six) hours as needed for cough.    . levothyroxine (SYNTHROID, LEVOTHROID) 50 MCG tablet Take 50 mcg by mouth daily before breakfast.    . metFORMIN (GLUCOPHAGE) 500 MG tablet Take 500 mg by mouth 2 (two) times daily with a meal.     . metoprolol tartrate (LOPRESSOR) 25 MG tablet Take 25 mg by mouth daily.    . predniSONE (DELTASONE) 10 MG tablet Take 10 mg by mouth daily with breakfast.    . spironolactone (ALDACTONE) 25 MG tablet Take 25 mg by mouth daily.    . valsartan-hydrochlorothiazide (DIOVAN-HCT) 320-25 MG per tablet Take 1 tablet by mouth every morning.     . valsartan-hydrochlorothiazide (DIOVAN-HCT) 320-25 MG per tablet Take by mouth.    . Ascorbic Acid (VITAMIN C) 1000 MG tablet Take 1,000 mg by mouth daily. Reported on 06/13/2015    . cephALEXin (KEFLEX) 500 MG capsule Take 1 capsule (500 mg total) by mouth 4 (four) times daily. (Patient not taking:  Reported on 02/07/2015) 40 capsule 0  . docusate sodium 100 MG CAPS Take 100 mg by mouth 2 (two) times daily. (Patient not taking: Reported on 02/07/2015) 60 capsule 0  . oxyCODONE (OXY IR/ROXICODONE) 5 MG immediate release tablet Take 2 tablets (10 mg total) by mouth every 4 (four) hours as needed for severe pain. (Patient not taking: Reported on 02/07/2015) 50 tablet 0  . oxyCODONE 10 MG TABS Take 1-2 tablets (10-20 mg total) by mouth every 4 (  four) hours as needed (  for mild pain,  for moderate pain,  for severe pain). (Patient not taking: Reported on 02/07/2015) 50 tablet 0  . pregabalin (LYRICA) 75 MG capsule Take 1 capsule (75 mg total) by mouth 2 (two) times daily. (Patient not taking: Reported on 02/07/2015) 60 capsule 0  . Pyridoxine HCl (VITAMIN B-6 PO) Take 1,000 mg by mouth daily. Reported on 06/13/2015     No current facility-administered medications for this visit.    REVIEW OF SYSTEMS: see HPI for pertinent positives and negatives    Physical Examination  Filed Vitals:   06/13/15 0916 06/13/15 0919  BP: 151/86 150/91  Pulse: 50   Temp: 97.8 F (36.6 C)   TempSrc: Oral   Height:  (1.778 m)   Weight: 239 lb 6.4 oz (108.591 kg)   SpO2: 98%    Body mass index is 34.35 kg/(m^2).  General: The patient appears their stated age.   HEENT:  No gross abnormalities Pulmonary: Respirations are non-labored Abdomen: Soft and non-tender. Musculoskeletal: There are no major deformities.   Neurologic: No focal weakness or paresthesias are detected, Skin: There are no ulcer or rashes noted. Psychiatric: The patient has normal affect. Cardiovascular: There is a regular rate and rhythm without significant murmur appreciated.   Vascular: Vessel Right Left  Radial 2+Palpable 2+Palpable  Brachial Palpable Palpable  Carotid  without bruit  without bruit  Aorta Not palpable N/A  Popliteal Not palpable Not palpable  PT Palpable Palpable  DP Palpable Palpable    Non-Invasive Vascular Imaging Upper Extremity arterial duplex (Date: 06/13/2015) The left brachial and axillary veins appear patent and compressible with normal venous waveforms noted. The left basilic and cephalic veins are compressible. Patent left brachial artery repair site with no evidence of hemodynamically significant stenosis throughout the left upper extremity arterial system.  No significant change compared to 02/07/15 exam.  Medical Decision Making  Joshua Nielsen is a 60 y.o. male who is s/p left brachial artery repair from traumatic injury on 10/15/13. The left upper arm pain is resolving after overuse of his left arm for 2 days.   Based on the patient's vascular studies and examination, I have offered the patient: return on 02/13/16 as already scheduled for left arm duplex and see me.  I advised him to notify us immediatly if he has concerns about the circulation in his left arm.  Hillary Schwegler, Carma Lair, RN, MSN, FNP-C [facility] Office: [phone]  06/13/2015, 9:29 AM  Clinic MD: Myra Gianotti

## 2016-02-08 ENCOUNTER — Encounter: Payer: Self-pay | Admitting: Family

## 2016-02-13 ENCOUNTER — Ambulatory Visit (INDEPENDENT_AMBULATORY_CARE_PROVIDER_SITE_OTHER): Payer: Worker's Compensation | Admitting: Family

## 2016-02-13 ENCOUNTER — Encounter: Payer: Self-pay | Admitting: Family

## 2016-02-13 ENCOUNTER — Ambulatory Visit (HOSPITAL_COMMUNITY)
Admission: RE | Admit: 2016-02-13 | Discharge: 2016-02-13 | Disposition: A | Discharge status: Home / Self Care | Payer: Worker's Compensation | Source: Ambulatory Visit | Attending: Family | Admitting: Family

## 2016-02-13 VITALS — BP 122/77 | HR 60 | Temp 97.9°F | Resp 18 | Ht 70.0 in | Wt 245.0 lb

## 2016-02-13 DIAGNOSIS — S45112S Laceration of brachial artery, left side, sequela: Secondary | ICD-10-CM | POA: Insufficient documentation

## 2016-02-13 DIAGNOSIS — X58XXXS Exposure to other specified factors, sequela: Secondary | ICD-10-CM | POA: Insufficient documentation

## 2016-02-13 NOTE — Progress Notes (Signed)
[facility] HISTORY AND PHYSICAL   CC: Follow up s/p left brachial artery repair after traumatic injury   History of Present Illness:   Joshua Nielsen is a 60 y.o. male patient of Dr. Myra Gianotti who initially presented on 10/15/2013 with trauma to his left arm while at work. He was taken to the operating room performed foreign body removal by Dr. Eulah Pont. He developed recurrent bleeding and therefore Dr. Myra Gianotti went in and evaluated the artery. 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 Postoperatively he was having worsening neuropathic issues and therefore Dr. Myra Gianotti had him evaluated by Dr. Butler Denmark. He reexplored the wound and did not find any correctable issues to the median nerve.  The patient continues to have some neuropathic pain especially with light touch to his left arm. He is back at work. He has good grip strength.  He returns today for routine follow up.  Dr. Myra Gianotti last saw pt on 02/07/15. At that time there was minimal plaquing at the arteriotomy repair site. He had triphasic waveforms. Dr. Myra Gianotti discussed the importance of annual surveillance to make sure that he doesn't develop a stenosis at the arteriotomy repair site over time. He had mild disease at that time Dr. Myra Gianotti also discussed checking pt's radial pulse if he develops any symptoms in his left arm. He was to follow-up again in 1 year.   He has DM, states he thinks it is in good control. He denies any history of tobacco use.  He is not on a statin, states his cholesterol is OK. He takes a daily ASA.    Current Outpatient Prescriptions  Medication Sig Dispense Refill  . amLODipine-benazepril (LOTREL) 5-10 MG per capsule Take 1 capsule by mouth daily.    . Ascorbic Acid (VITAMIN C) 1000 MG tablet Take 1,000 mg by mouth daily. Reported on 06/13/2015    . aspirin EC 81 MG EC tablet Take 1 tablet (81 mg total) by mouth daily.    Marland Kitchen  docusate sodium 100 MG CAPS Take 100 mg by mouth 2 (two) times daily. (Patient not taking: Reported on 02/07/2015) 60 capsule 0  . EPINEPHrine (EPIPEN) 0.3 mg/0.3 mL IJ SOAJ injection Inject 0.3 mg into the muscle once.    Marland Kitchen glipiZIDE (GLUCOTROL XL) 2.5 MG 24 hr tablet Take 2.5 mg by mouth daily with breakfast.    . HYDROcodone-homatropine (HYCODAN) 5-1.5 MG/5ML syrup Take 5 mLs by mouth every 6 (six) hours as needed for cough.    . levothyroxine (SYNTHROID, LEVOTHROID) 50 MCG tablet Take 50 mcg by mouth daily before breakfast.    . metFORMIN (GLUCOPHAGE) 500 MG tablet Take 500 mg by mouth 2 (two) times daily with a meal.     . metoprolol tartrate (LOPRESSOR) 25 MG tablet Take 25 mg by mouth daily.    Marland Kitchen oxyCODONE (OXY IR/ROXICODONE) 5 MG immediate release tablet Take 2 tablets (10 mg total) by mouth every 4 (four) hours as needed for severe pain. (Patient not taking: Reported on 02/07/2015) 50 tablet 0  . oxyCODONE 10 MG TABS Take 1-2 tablets (10-20 mg total) by mouth every 4 (four) hours as needed (10mg  for mild pain, 15mg  for moderate pain, 20mg  for severe pain). (Patient not taking: Reported on 02/07/2015) 50 tablet 0  . predniSONE (DELTASONE) 10 MG tablet Take 10 mg by mouth daily with breakfast.    . pregabalin (LYRICA) 75 MG capsule Take 1 capsule (75 mg total) by mouth 2 (  two) times daily. (Patient not taking: Reported on 02/07/2015) 60 capsule 0  . Pyridoxine HCl (VITAMIN B-6 PO) Take 1,000 mg by mouth daily. Reported on 06/13/2015    . spironolactone (ALDACTONE) 25 MG tablet Take 25 mg by mouth daily.    . valsartan-hydrochlorothiazide (DIOVAN-HCT) 320-25 MG per tablet Take 1 tablet by mouth every morning.      No current facility-administered medications for this visit.     Past Medical History:  Diagnosis Date  . Anxiety    "white coat syndrome" (09/19/2012)  . Arthritis    "knees" (09/19/2012)  . Arthritis    "back and legs" (10/15/2013)  . Chronic back pain    "neck to lower back"  (09/19/2012)  . Complication of anesthesia    "trouble getting BM after back OR"  . History of blood transfusion 10/15/2013  . Hypertension   . Hypothyroidism   . OSA on CPAP   . PONV (postoperative nausea and vomiting)   . Type II diabetes mellitus (HCC) 05/2012  . Type II diabetes mellitus (HCC)     Social History Social History  Substance Use Topics  . Smoking status: Never Smoker  . Smokeless tobacco: Former Neurosurgeon     Comment: "quit chewing in the 1980's"  . Alcohol use No    Family History Family History  Problem Relation Age of Onset  . Heart disease Mother     before age 26  . Heart disease Father     before age 35  . Heart disease Sister     before age 58    Surgical History Past Surgical History:  Procedure Laterality Date  . ANTERIOR CERVICAL DECOMP/DISCECTOMY FUSION  2008  . ANTERIOR CERVICAL DECOMP/DISCECTOMY FUSION  2009  . APPENDECTOMY  1981  . ARTERY REPAIR Left 10/15/2013   brachial  . ARTERY REPAIR Left 10/15/2013   Procedure: PRIMARY BRACHIAL ARTERY REPAIR;  Surgeon: Nada Libman, MD;  Location: [facility] OR;  Service: Vascular;  Laterality: Left;  . BACK SURGERY    . BICEPS TENDON REPAIR Right 2010  . CHOLECYSTECTOMY  2010  . CHOLECYSTECTOMY  ~ 2007  . COLONOSCOPY  2011  . EXCISIONAL HEMORRHOIDECTOMY  2005  . HERNIA REPAIR  2012   "incisional; had to also go in around my navel" (09/19/2012)  . HERNIA REPAIR  ~ 2007   umbilical  . I&D EXTREMITY Left 10/15/2013   upper arm w/removal of foreign body  . I&D EXTREMITY Left 10/15/2013   Procedure: IRRIGATION AND DEBRIDEMENT EXTREMITY ;  Surgeon: Sheral Apley, MD;  Location: [facility] OR;  Service: Orthopedics;  Laterality: Left;  . KNEE ARTHROSCOPY Bilateral   . KNEE ARTHROSCOPY Right X 2  . KNEE ARTHROSCOPY Left X 1  . NERVE AND ARTERY REPAIR Left 11/19/2013   Procedure: LEFT ARM IRRIGATION AND DEBRIDEMENT; SCAR REVISION; MEDIAN NERVE TENOLYSIS AND NEUROLYSIS; ARTERY EXPLORATION;  Surgeon: Dominica Severin, MD;   Location: [facility] OR;  Service: Orthopedics;  Laterality: Left;  . POSTERIOR LUMBAR FUSION  09/19/2012  . POSTERIOR LUMBAR FUSION  ~ 2012   "put rods in"  . VARICOSE VEIN SURGERY Left 1980's  . WOUND EXPLORATION Left 10/15/2013   Procedure: WOUND EXPLORATION - LEFT ARM OPERATIVE SITE;  Surgeon: Sheral Apley, MD;  Location: [facility] OR;  Service: Orthopedics;  Laterality: Left;    Allergies  Allergen Reactions  . Bee Venom Anaphylaxis  . Levaquin [Levofloxacin] Other (See Comments)    Severe joint pain  . Levofloxacin Other (See Comments)  Severe joint pain  . Demerol [Meperidine] Itching and Rash    Rash is severe    Current Outpatient Prescriptions  Medication Sig Dispense Refill  . amLODipine-benazepril (LOTREL) 5-10 MG per capsule Take 1 capsule by mouth daily.    . Ascorbic Acid (VITAMIN C) 1000 MG tablet Take 1,000 mg by mouth daily. Reported on 06/13/2015    . aspirin EC 81 MG EC tablet Take 1 tablet (81 mg total) by mouth daily.    Marland Kitchen. docusate sodium 100 MG CAPS Take 100 mg by mouth 2 (two) times daily. (Patient not taking: Reported on 02/07/2015) 60 capsule 0  . EPINEPHrine (EPIPEN) 0.3 mg/0.3 mL IJ SOAJ injection Inject 0.3 mg into the muscle once.    Marland Kitchen. glipiZIDE (GLUCOTROL XL) 2.5 MG 24 hr tablet Take 2.5 mg by mouth daily with breakfast.    . HYDROcodone-homatropine (HYCODAN) 5-1.5 MG/5ML syrup Take 5 mLs by mouth every 6 (six) hours as needed for cough.    . levothyroxine (SYNTHROID, LEVOTHROID) 50 MCG tablet Take 50 mcg by mouth daily before breakfast.    . metFORMIN (GLUCOPHAGE) 500 MG tablet Take 500 mg by mouth 2 (two) times daily with a meal.     . metoprolol tartrate (LOPRESSOR) 25 MG tablet Take 25 mg by mouth daily.    Marland Kitchen. oxyCODONE (OXY IR/ROXICODONE) 5 MG immediate release tablet Take 2 tablets (10 mg total) by mouth every 4 (four) hours as needed for severe pain. (Patient not taking: Reported on 02/07/2015) 50 tablet 0  . oxyCODONE 10 MG TABS Take 1-2 tablets (10-20 mg total)  by mouth every 4 (four) hours as needed (10mg  for mild pain, 15mg  for moderate pain, 20mg  for severe pain). (Patient not taking: Reported on 02/07/2015) 50 tablet 0  . predniSONE (DELTASONE) 10 MG tablet Take 10 mg by mouth daily with breakfast.    . pregabalin (LYRICA) 75 MG capsule Take 1 capsule (75 mg total) by mouth 2 (two) times daily. (Patient not taking: Reported on 02/07/2015) 60 capsule 0  . Pyridoxine HCl (VITAMIN B-6 PO) Take 1,000 mg by mouth daily. Reported on 06/13/2015    . spironolactone (ALDACTONE) 25 MG tablet Take 25 mg by mouth daily.    . valsartan-hydrochlorothiazide (DIOVAN-HCT) 320-25 MG per tablet Take 1 tablet by mouth every morning.      No current facility-administered medications for this visit.      REVIEW OF SYSTEMS: See HPI for pertinent positives and negatives.  Physical Examination Vitals:   02/13/16 1403  BP: 122/77  Pulse: 60  Resp: 18  Temp: 97.9 F (36.6 C)  TempSrc: Oral  SpO2: 96%  Weight: 245 lb (111.1 kg)  Height: 5\' 10"  (1.778 m)   Body mass index is 35.15 kg/m.  General: The patient appears their stated age.   HEENT:  No gross abnormalities Pulmonary: Respirations are non-labored Abdomen: Soft and non-tender. Musculoskeletal: There are no major deformities.   Neurologic: No focal weakness or paresthesias are detected, Skin: There are no ulcer or rashes noted. Psychiatric: The patient has normal affect. Cardiovascular: There is a regular rate and rhythm without significant murmur appreciated.   Vascular: Vessel Right Left  Radial 2+Palpable 2+Palpable  Brachial Palpable Palpable  Carotid  without bruit  without bruit  Aorta Not palpable N/A  Popliteal Not palpable Not palpable  PT Palpable Palpable  DP Palpable Palpable    ASSESSMENT:  Joshua Nielsen is a 60 y.o. male who is s/p left brachial artery repair after traumatic injury  on 10/15/13.  Pt states this is under workman's compensation. He is back at work since September  2015, light duty until January 2016 at which time he started doing his regular job.  Pt states workman's comp will pay for yearly evaluation of his injured arm to age 62.   DATA Today's left UE arterial duplex demonstrates a widely patent left brachial artery without evidence of restenosis or hyperplasia.  No significant change compared to the previous exam on 06/13/15.   PLAN:   Based on today's exam and non-invasive vascular lab results, the patient will follow up in 1 year with the following tests: left UE arterial duplex and see me for evaluation and discussion of results. I advised him to notify us immediately if he stops feeling a left radial pulse or if develops concerns re the circulation in his left arm. I discussed in depth with the patient the nature of atherosclerosis, and emphasized the importance of maximal medical management including strict control of blood pressure, blood glucose, and lipid levels, obtaining regular exercise, and cessation of smoking.  The patient is aware that without maximal medical management the underlying atherosclerotic disease process will progress, limiting the benefit of any interventions.  Thank you for allowing [place] to participate in this patient's care.  Charisse March, RN, MSN, FNP-C [facility] Office: [phone]  Clinic MD: Myra Gianotti 02/13/2016 2:13 PM

## 2016-04-10 NOTE — Addendum Note (Signed)
Addended by: Burton ApleyPETTY, Vertie Dibbern A on: 04/10/2016 10:49 AM   Modules accepted: Orders

## 2017-02-25 ENCOUNTER — Other Ambulatory Visit (HOSPITAL_COMMUNITY): Payer: Self-pay

## 2017-02-25 ENCOUNTER — Ambulatory Visit: Payer: Self-pay | Admitting: Family

## 2019-07-17 DIAGNOSIS — Z01818 Encounter for other preprocedural examination: Secondary | ICD-10-CM

## 2024-02-17 ENCOUNTER — Ambulatory Visit: Payer: Self-pay | Admitting: Physician Assistant

## 2024-02-27 ENCOUNTER — Ambulatory Visit (INDEPENDENT_AMBULATORY_CARE_PROVIDER_SITE_OTHER): Payer: Medicare (Managed Care) | Admitting: Physician Assistant

## 2024-02-27 ENCOUNTER — Encounter: Payer: Self-pay | Admitting: Physician Assistant

## 2024-02-27 VITALS — BP 92/68 | HR 56 | Temp 97.8°F | Ht 70.0 in | Wt 220.0 lb

## 2024-02-27 DIAGNOSIS — Z125 Encounter for screening for malignant neoplasm of prostate: Secondary | ICD-10-CM

## 2024-02-27 DIAGNOSIS — E039 Hypothyroidism, unspecified: Secondary | ICD-10-CM | POA: Diagnosis not present

## 2024-02-27 DIAGNOSIS — R634 Abnormal weight loss: Secondary | ICD-10-CM | POA: Diagnosis not present

## 2024-02-27 DIAGNOSIS — I10 Essential (primary) hypertension: Secondary | ICD-10-CM

## 2024-02-27 DIAGNOSIS — E1165 Type 2 diabetes mellitus with hyperglycemia: Secondary | ICD-10-CM | POA: Diagnosis not present

## 2024-02-27 DIAGNOSIS — F331 Major depressive disorder, recurrent, moderate: Secondary | ICD-10-CM

## 2024-02-27 DIAGNOSIS — Z794 Long term (current) use of insulin: Secondary | ICD-10-CM

## 2024-02-27 DIAGNOSIS — Z7689 Persons encountering health services in other specified circumstances: Secondary | ICD-10-CM | POA: Insufficient documentation

## 2024-02-27 NOTE — Assessment & Plan Note (Addendum)
 Chronic depression and fatigue, likely exacerbated by stress related to wife's illness. Symptoms may be related to depression rather than anemia. Blood pressure may be contributing to fatigue. - Continue Cymbalta 60 mg daily. - Continue Wellbutrin. - Reduce metoprolol  to once daily. - Order blood work including B12. - Schedule blood pressure check next week.

## 2024-02-27 NOTE — Progress Notes (Signed)
 Subjective:  Patient ID: Joshua Nielsen, male    DOB: 1955/10/21  Age: 68 y.o. MRN: [id number]  Chief Complaint  Patient presents with   New to establish    Patient is establishing as a new patient.  HPI  Discussed the use of AI scribe software for clinical note transcription with the patient, who gave verbal consent to proceed.  History of Present Illness [name] is a 68 year old male who presents with fatigue and depression.  He experiences chronic fatigue and lack of motivation. His wife was diagnosed with brain cancer last December and has undergone surgery, radiation, and chemotherapy, which has significantly impacted his emotional well-being. He has been on Cymbalta for two to three years for anxiety and depression, with symptoms managed until this year. No significant side effects from Cymbalta. He denies trying other medications for these symptoms.  He experiences sleep disturbances, getting approximately six to seven hours of sleep per night, but it is not solid sleep as he wakes up frequently to urinate, about three to four times a night. No abnormal PSA values or issues with feeling like he hasn't fully emptied his bladder.  He has a history of neck fusion and reports occasional residual pain, which is bearable. He has not had any follow-up with neurosurgery recently.  He reports taking metoprolol  for blood pressure management. He experiences dizziness and lightheadedness when changing positions. No recent fluid buildup in his legs.  He has a history of hemorrhoids removal and a good colonoscopy, with no recent blood in his stool. No issues with his thyroid medication and has not had to change it recently.      02/27/2024   11:24 AM  Depression screen PHQ 2/9  Decreased Interest 1  Down, Depressed, Hopeless 1  PHQ - 2 Score 2  Altered sleeping 2  Tired, decreased energy 2  Change in appetite 0  Feeling bad or failure about yourself  1  Trouble concentrating  0  Moving slowly or fidgety/restless 0  Suicidal thoughts 0  PHQ-9 Score 7         02/27/2024   11:43 AM  Fall Risk  Falls in the past year? 0  Was there an injury with Fall? 0  Fall Risk Category Calculator 0  Patient at Risk for Falls Due to No Fall Risks  Fall risk Follow up Falls evaluation completed     Current Outpatient Medications on File Prior to Visit  Medication Sig Dispense Refill   amLODipine  (NORVASC ) 5 MG tablet Take 5 mg by mouth daily.     buPROPion (WELLBUTRIN XL) 300 MG 24 hr tablet Take 300 mg by mouth daily.     cyanocobalamin (VITAMIN B12) 1000 MCG tablet Take 1,500 mcg by mouth daily.     DULoxetine (CYMBALTA) 60 MG capsule Take 60 mg by mouth daily.     EPINEPHrine  (EPIPEN ) 0.3 mg/0.3 mL IJ SOAJ injection Inject 0.3 mg into the muscle once. (Patient taking differently: Inject 0.3 mg into the muscle as needed for anaphylaxis.)     famotidine (PEPCID) 20 MG tablet Take 20 mg by mouth daily.     furosemide (LASIX) 20 MG tablet Take 20 mg by mouth daily.     insulin  lispro (HUMALOG) 100 UNIT/ML KwikPen Sliding scale     Insulin  Pen Needle 31G X 8 MM MISC 3 (three) times daily. as directed     levothyroxine  (SYNTHROID ) 88 MCG tablet Take 88 mcg by mouth every morning.  losartan (COZAAR) 100 MG tablet Take 100 mg by mouth daily.     magnesium oxide (MAG-OX) 400 (240 Mg) MG tablet Take 400 mg by mouth daily.     metFORMIN  (GLUCOPHAGE ) 500 MG tablet Take 500 mg by mouth 2 (two) times daily with a meal.      metoprolol  tartrate (LOPRESSOR ) 25 MG tablet Take 25 mg by mouth 2 (two) times daily.     primidone (MYSOLINE) 50 MG tablet Take 50 mg by mouth at bedtime.     rosuvastatin (CRESTOR) 10 MG tablet Take 1 tablet two days weekly     spironolactone (ALDACTONE) 50 MG tablet Take 50 mg by mouth daily.     TRESIBA FLEXTOUCH 100 UNIT/ML FlexTouch Pen Inject 40 Units into the skin daily.     ascorbic acid (VITAMIN C) 1000 MG tablet Take 1,000 mg by mouth daily.      No current facility-administered medications on file prior to visit.  . Social History   Socioeconomic History   Marital status: Married    Spouse name: Not on file   Number of children: Not on file   Years of education: Not on file   Highest education level: Not on file  Occupational History   Not on file  Tobacco Use   Smoking status: Never   Smokeless tobacco: Former   Tobacco comments:    quit chewing in the 1980's  Substance and Sexual Activity   Alcohol use: No   Drug use: No   Sexual activity: Yes  Other Topics Concern   Not on file  Social History Narrative   ** Merged History Encounter **       Social Drivers of Health   Financial Resource Strain: Low Risk  (07/25/2023)   Received from [facility]   Overall Financial Resource Strain (CARDIA)    Difficulty of Paying Living Expenses: Not hard at all  Food Insecurity: No Food Insecurity (07/25/2023)   Received [facility]   Hunger Vital Sign    Within the past 12 months, you worried that your food would run out before you got the money to buy more.: Never true    Within the past 12 months, the food you bought just didn't last and you didn't have money to get more.: Never true  Transportation Needs: No Transportation Needs (07/25/2023)   Received from [facility]    Lack of Transportation (Medical): No    Lack of Transportation (Non-Medical): No  Physical Activity: Not on file  Stress: Not on file  Social Connections: Not on file   Past Medical History:  Diagnosis Date   Anxiety    white coat syndrome (09/19/2012)   Arthritis    knees (09/19/2012)   Arthritis    back and legs (10/15/2013)   Chronic back pain    neck to lower back (09/19/2012)   Complication of anesthesia    trouble getting BM after back OR   History of blood transfusion 10/15/2013   Hypertension    Hypothyroidism    OSA on CPAP    PONV (postoperative nausea and vomiting)    Type II diabetes  mellitus (HCC) 05/2012   Type II diabetes mellitus (HCC)    Family History  Problem Relation Age of Onset   Heart disease Mother        before age 44   Heart disease Father        before age 34   Heart disease Sister  before age 41    Review of Systems  Constitutional:  Negative for appetite change, fatigue and fever.  HENT:  Negative for congestion, ear pain, sinus pressure and sore throat.   Respiratory:  Negative for cough, chest tightness, shortness of breath and wheezing.   Cardiovascular:  Negative for chest pain and palpitations.  Gastrointestinal:  Negative for abdominal pain, constipation, diarrhea, nausea and vomiting.  Genitourinary:  Negative for dysuria and hematuria.  Musculoskeletal:  Negative for arthralgias, back pain, joint swelling and myalgias.  Skin:  Negative for rash.  Neurological:  Negative for dizziness, weakness and headaches.  Psychiatric/Behavioral:  Negative for dysphoric mood. The patient is not nervous/anxious.      Objective:  BP 92/68 (BP Location: Right Arm, Patient Position: Sitting)   Pulse (!) 56   Temp 97.8 F (36.6 C) (Temporal)   Ht 5' 10 (1.778 m)   Wt 220 lb (99.8 kg)   SpO2 96%   BMI 31.57 kg/m      02/27/2024   11:27 AM 02/13/2016    2:03 PM 06/13/2015    9:19 AM  BP/Weight  Systolic BP 92 122 150  Diastolic BP 68 77 91  Wt. (Lbs) 220 245   BMI 31.57 kg/m2 35.15 kg/m2     Physical Exam Vitals reviewed.  Constitutional:      Appearance: Normal appearance.  Cardiovascular:     Rate and Rhythm: Normal rate and regular rhythm.     Heart sounds: Normal heart sounds.  Pulmonary:     Effort: Pulmonary effort is normal.     Breath sounds: Normal breath sounds.  Abdominal:     General: Bowel sounds are normal.     Palpations: Abdomen is soft.     Tenderness: There is no abdominal tenderness.  Neurological:     Mental Status: He is alert and oriented to person, place, and time.  Psychiatric:        Mood and  Affect: Mood normal.        Behavior: Behavior normal.    Diabetic Foot Exam - Simple   No data filed      Lab Results  Component Value Date   WBC 6.1 11/11/2013   HGB 11.2 (L) 11/11/2013   HCT 36.7 (L) 11/11/2013   PLT 242 11/11/2013   GLUCOSE 161 (H) 11/11/2013   ALT 25 10/15/2013   AST 25 10/15/2013   NA 141 11/11/2013   K 3.5 (L) 11/11/2013   CL 101 11/11/2013   CREATININE 0.82 11/11/2013   BUN 12 11/11/2013   CO2 25 11/11/2013   INR 1.08 10/15/2013      Assessment & Plan:   Assessment & Plan Establishing care with new doctor, encounter for Establishing care due to insurance issues with previous provider. No major concerns or questions at this time.    Type 2 diabetes mellitus with hyperglycemia, with long-term current use of insulin  (HCC) Currently managed with [place]. Insurance will require a change to either Lantus or Toujeo starting in January. - Switch from [place] to either Lantus or Toujeo in January. - Ensure adequate supply of Tresiba until January. Orders:   Hemoglobin A1c   Lipid panel  Primary hypertension Blood pressure currently low, possibly contributing to fatigue and dizziness. Symptoms suggest possible orthostatic hypotension. - Reduce metoprolol  to once daily. - Schedule blood pressure check next week. Orders:   CBC with Differential/Platelet   Comprehensive metabolic panel with GFR  Acquired hypothyroidism Well-managed on current thyroid medication with no recent changes  or issues reported. Orders:   T4, free   TSH  Abnormal weight loss Possibly due to emotional stress of helping his wife with cancer treatments Continue to monitor diet and exercise Will consider adjusting depression and anxiety medicine if labs come back normal Orders:   B12 and Folate Panel   VITAMIN D 25 Hydroxy (Vit-D Deficiency, Fractures)  Moderate episode of recurrent major depressive disorder (HCC) Chronic depression and fatigue, likely exacerbated by  stress related to wife's illness. Symptoms may be related to depression rather than anemia. Blood pressure may be contributing to fatigue. - Continue Cymbalta 60 mg daily. - Continue Wellbutrin. - Reduce metoprolol  to once daily. - Order blood work including B12. - Schedule blood pressure check next week.    Screening PSA (prostate specific antigen) Continue to monitor nocturia Will discuss treatment options of PSA normal Orders:   PSA    No orders of the defined types were placed in this encounter.  Orders Placed This Encounter  Procedures   CBC with Differential/Platelet   Comprehensive metabolic panel with GFR   Hemoglobin A1c   Lipid panel   T4, free   TSH   PSA   B12 and Folate Panel   VITAMIN D 25 Hydroxy (Vit-D Deficiency, Fractures)    Follow-up: Return in about 3 months (around 05/29/2024) for Chronic, nurse visit.  AVS was given to patient prior to departure.   I,Lauren M Auman,acting as a Neurosurgeon for [employer], PA.,have documented all relevant documentation on the behalf of Nola Angles, PA,as directed by  Nola Angles, PA while in the presence of Nola Angles, [place].    LILLETTE Kato I Leal-Borjas,acting as a scribe for Nola Angles, PA.,have documented all relevant documentation on the behalf of Nola Angles, PA,as directed by  Nola Angles, PA while in the presence of Nola Angles, [place].   Nola Angles, [place] [facility] [phone]

## 2024-02-27 NOTE — Assessment & Plan Note (Signed)
 Currently managed with [place]. Insurance will require a change to either Lantus or Toujeo starting in January. - Switch from [place] to either Lantus or Toujeo in January. - Ensure adequate supply of Tresiba until January. Orders:   Hemoglobin A1c   Lipid panel

## 2024-02-27 NOTE — Assessment & Plan Note (Signed)
 Blood pressure currently low, possibly contributing to fatigue and dizziness. Symptoms suggest possible orthostatic hypotension. - Reduce metoprolol  to once daily. - Schedule blood pressure check next week. Orders:   CBC with Differential/Platelet   Comprehensive metabolic panel with GFR

## 2024-02-27 NOTE — Assessment & Plan Note (Signed)
 Establishing care due to insurance issues with previous provider. No major concerns or questions at this time.

## 2024-02-27 NOTE — Assessment & Plan Note (Signed)
 Well-managed on current thyroid medication with no recent changes or issues reported. Orders:   T4, free   TSH

## 2024-02-27 NOTE — Assessment & Plan Note (Addendum)
 Possibly due to emotional stress of helping his wife with cancer treatments Continue to monitor diet and exercise Will consider adjusting depression and anxiety medicine if labs come back normal Orders:   B12 and Folate Panel   VITAMIN D 25 Hydroxy (Vit-D Deficiency, Fractures)

## 2024-02-27 NOTE — Assessment & Plan Note (Addendum)
 Continue to monitor nocturia Will discuss treatment options of PSA normal Orders:   PSA

## 2024-02-28 LAB — CBC WITH DIFFERENTIAL/PLATELET
Basophils Absolute: 0.1 x10E3/uL (ref 0.0–0.2)
Basos: 1 %
EOS (ABSOLUTE): 0.2 x10E3/uL (ref 0.0–0.4)
Eos: 3 %
Hematocrit: 48.2 % (ref 37.5–51.0)
Hemoglobin: 15.1 g/dL (ref 13.0–17.7)
Immature Grans (Abs): 0 x10E3/uL (ref 0.0–0.1)
Immature Granulocytes: 0 %
Lymphocytes Absolute: 1.5 x10E3/uL (ref 0.7–3.1)
Lymphs: 19 %
MCH: 28.1 pg (ref 26.6–33.0)
MCHC: 31.3 g/dL — ABNORMAL LOW (ref 31.5–35.7)
MCV: 90 fL (ref 79–97)
Monocytes Absolute: 1.1 x10E3/uL — ABNORMAL HIGH (ref 0.1–0.9)
Monocytes: 14 %
Neutrophils Absolute: 5 x10E3/uL (ref 1.4–7.0)
Neutrophils: 63 %
Platelets: 276 x10E3/uL (ref 150–450)
RBC: 5.37 x10E6/uL (ref 4.14–5.80)
RDW: 13.2 % (ref 11.6–15.4)
WBC: 7.8 x10E3/uL (ref 3.4–10.8)

## 2024-02-28 LAB — HEMOGLOBIN A1C
Est. average glucose Bld gHb Est-mCnc: 364 mg/dL
Hgb A1c MFr Bld: 14.3 % — ABNORMAL HIGH (ref 4.8–5.6)

## 2024-02-28 LAB — COMPREHENSIVE METABOLIC PANEL WITH GFR
ALT: 15 IU/L (ref 0–44)
AST: 20 IU/L (ref 0–40)
Albumin: 4.1 g/dL (ref 3.9–4.9)
Alkaline Phosphatase: 110 IU/L (ref 47–123)
BUN/Creatinine Ratio: 11 (ref 10–24)
BUN: 14 mg/dL (ref 8–27)
Bilirubin Total: 1.5 mg/dL — ABNORMAL HIGH (ref 0.0–1.2)
CO2: 25 mmol/L (ref 20–29)
Calcium: 9.5 mg/dL (ref 8.6–10.2)
Chloride: 95 mmol/L — ABNORMAL LOW (ref 96–106)
Creatinine, Ser: 1.29 mg/dL — ABNORMAL HIGH (ref 0.76–1.27)
Globulin, Total: 3.2 g/dL (ref 1.5–4.5)
Glucose: 126 mg/dL — ABNORMAL HIGH (ref 70–99)
Potassium: 4.3 mmol/L (ref 3.5–5.2)
Sodium: 136 mmol/L (ref 134–144)
Total Protein: 7.3 g/dL (ref 6.0–8.5)
eGFR: 60 mL/min/1.73 (ref >59)

## 2024-02-28 LAB — PSA: Prostate Specific Ag, Serum: 1.1 ng/mL (ref 0.0–4.0)

## 2024-02-28 LAB — TSH: TSH: 2.03 u[IU]/mL (ref 0.450–4.500)

## 2024-02-28 LAB — LIPID PANEL
Chol/HDL Ratio: 3 ratio (ref 0.0–5.0)
Cholesterol, Total: 133 mg/dL (ref 100–199)
HDL: 44 mg/dL (ref >39)
LDL Chol Calc (NIH): 62 mg/dL (ref 0–99)
Triglycerides: 156 mg/dL — ABNORMAL HIGH (ref 0–149)
VLDL Cholesterol Cal: 27 mg/dL (ref 5–40)

## 2024-02-28 LAB — VITAMIN D 25 HYDROXY (VIT D DEFICIENCY, FRACTURES): Vit D, 25-Hydroxy: 32.9 ng/mL (ref 30.0–100.0)

## 2024-02-28 LAB — T4, FREE: Free T4: 1.24 ng/dL (ref 0.82–1.77)

## 2024-02-28 LAB — B12 AND FOLATE PANEL
Folate: 13.4 ng/mL (ref >3.0)
Vitamin B-12: 1619 pg/mL — ABNORMAL HIGH (ref 232–1245)

## 2024-03-02 ENCOUNTER — Ambulatory Visit: Payer: Self-pay | Admitting: Physician Assistant

## 2024-03-04 ENCOUNTER — Ambulatory Visit: Payer: Medicare (Managed Care)

## 2024-03-04 MED ORDER — FREESTYLE LIBRE 3 PLUS SENSOR MISC: 3 refills | Status: AC

## 2024-03-04 NOTE — Progress Notes (Signed)
 Patient is in office today for a nurse visit for Blood Pressure Check. Patient blood pressure was 120/80, Patient No chest pain, No shortness of breath, No dyspnea on exertion, No orthopnea, No paroxysmal nocturnal dyspnea, No edema, No palpitations, No syncope.  Patient does not have a blood pressure cuff at home to monitor BP.  Spoke with Nola, he stated we will try to get patient one ordered with his insurance to keep his phone close by for verification insurance/address and patient follow up with him in January and monitor blood pressure daily once he gets a machine.  Spoke with patient, verbalized understanding and had no questions at this time.

## 2024-03-05 ENCOUNTER — Ambulatory Visit: Payer: Medicare (Managed Care)

## 2024-04-21 ENCOUNTER — Ambulatory Visit: Payer: Medicare (Managed Care)

## 2024-05-19 ENCOUNTER — Ambulatory Visit (INDEPENDENT_AMBULATORY_CARE_PROVIDER_SITE_OTHER): Payer: Medicare (Managed Care)

## 2024-05-19 VITALS — BP 120/80 | Ht 70.0 in | Wt 240.0 lb

## 2024-05-19 DIAGNOSIS — Z0000 Encounter for general adult medical examination without abnormal findings: Secondary | ICD-10-CM | POA: Diagnosis not present

## 2024-05-19 NOTE — Progress Notes (Signed)
 " I connected with  Joshua Nielsen on 05/19/2024 by a audio enabled telemedicine application and verified that I am speaking with the correct person using two identifiers.  Patient Location: Home  Provider Location: Home Office  Persons Participating in Visit: Patient.  I discussed the limitations of evaluation and management by telemedicine. The patient expressed understanding and agreed to proceed.  Vital Signs: Because this visit was a virtual/telehealth visit, some criteria may be missing or patient reported. Any vitals not documented were not able to be obtained and vitals that have been documented are patient reported.  Chief Complaint  Patient presents with   Medicare Wellness     Subjective:   Joshua Nielsen is a 69 y.o. male who presents for a Medicare Annual Wellness Visit.  Visit info / Clinical Intake: Medicare Wellness Visit Type:: Subsequent Annual Wellness Visit Persons participating in visit and providing information:: patient Medicare Wellness Visit Mode:: Telephone If telephone:: video declined Since this visit was completed virtually, some vitals may be partially provided or unavailable. Missing vitals are due to the limitations of the virtual format.: Documented vitals are patient reported If Telephone or Video please confirm:: I connected with patient using audio/video enable telemedicine. I verified patient identity with two identifiers, discussed telehealth limitations, and patient agreed to proceed. Patient Location:: home Provider Location:: home office Interpreter Needed?: No Pre-visit prep was completed: yes AWV questionnaire completed by patient prior to visit?: yes Date:: 05/19/24 Living arrangements:: (Patient-Rptd) with family/others Patient's Overall Health Status Rating: (Patient-Rptd) good Typical amount of pain: (Patient-Rptd) some Does pain affect daily life?: (Patient-Rptd) no  Dietary Habits and Nutritional Risks How many meals a day?:  (Patient-Rptd) 3 Eats fruit and vegetables daily?: (!) (Patient-Rptd) no Most meals are obtained by: (Patient-Rptd) having others provide food In the last 2 weeks, have you had any of the following?: none Diabetic:: (!) yes Any non-healing wounds?: no How often do you check your BS?: 1 Would you like to be referred to a Nutritionist or for Diabetic Management? : no  Functional Status Activities of Daily Living (to include ambulation/medication): (Patient-Rptd) Independent Ambulation: (Patient-Rptd) Independent Medication Administration: (Patient-Rptd) Independent Home Management (perform basic housework or laundry): (Patient-Rptd) Independent Manage your own finances?: (Patient-Rptd) yes Primary transportation is: (Patient-Rptd) driving Concerns about vision?: no *vision screening is required for WTM* Concerns about hearing?: no  Fall Screening Falls in the past year?: (Patient-Rptd) 0 Number of falls in past year: 0 Was there an injury with Fall?: 0 Fall Risk Category Calculator: 0 Patient Fall Risk Level: Low Fall Risk  Fall Risk Patient at Risk for Falls Due to: No Fall Risks Fall risk Follow up: Falls evaluation completed; Falls prevention discussed  Home and Transportation Safety: All rugs have non-skid backing?: (Patient-Rptd) N/A, no rugs All stairs or steps have railings?: (Patient-Rptd) yes Grab bars in the bathtub or shower?: (!) (Patient-Rptd) no Have non-skid surface in bathtub or shower?: (Patient-Rptd) yes Good home lighting?: (Patient-Rptd) yes Regular seat belt use?: (Patient-Rptd) yes Hospital stays in the last year:: (Patient-Rptd) no  Cognitive Assessment Difficulty concentrating, remembering, or making decisions? : (Patient-Rptd) no Will 6CIT or Mini Cog be Completed: yes What year is it?: 0 points What month is it?: 0 points Give patient an address phrase to remember (5 components): remember words apple , table , penny About what time is it?: 0  points Count backwards from 20 to 1: 0 points Say the months of the year in reverse: 0 points Repeat the address phrase  from earlier: 0 points 6 CIT Score: 0 points  Advance Directives (For Healthcare) Does Patient Have a Medical Advance Directive?: Yes Does patient want to make changes to medical advance directive?: No - Patient declined Type of Advance Directive: Living will Copy of Living Will in Chart?: No - copy requested  Reviewed/Updated  Reviewed/Updated: Reviewed All (Medical, Surgical, Family, Medications, Allergies, Care Teams, Patient Goals)    Allergies (verified) Bee venom, Levaquin [levofloxacin], Levofloxacin, and Demerol  [meperidine ]   Current Medications (verified) Outpatient Encounter Medications as of 05/19/2024  Medication Sig   amLODipine  (NORVASC ) 5 MG tablet Take 5 mg by mouth daily.   buPROPion (WELLBUTRIN XL) 300 MG 24 hr tablet Take 300 mg by mouth daily.   Continuous Glucose Sensor (FREESTYLE LIBRE 3 PLUS SENSOR) MISC Change sensor every 15 days.   cyanocobalamin (VITAMIN B12) 1000 MCG tablet Take 1,500 mcg by mouth daily. (Patient taking differently: Take 1,500 mcg by mouth every other day.)   DULoxetine (CYMBALTA) 60 MG capsule Take 60 mg by mouth daily.   EPINEPHrine  (EPIPEN ) 0.3 mg/0.3 mL IJ SOAJ injection Inject 0.3 mg into the muscle once. (Patient taking differently: Inject 0.3 mg into the muscle as needed for anaphylaxis.)   furosemide (LASIX) 20 MG tablet Take 20 mg by mouth daily.   insulin  lispro (HUMALOG) 100 UNIT/ML KwikPen Sliding scale   Insulin  Pen Needle 31G X 8 MM MISC 3 (three) times daily. as directed   levothyroxine  (SYNTHROID ) 88 MCG tablet Take 88 mcg by mouth every morning.   losartan (COZAAR) 100 MG tablet Take 100 mg by mouth daily.   magnesium oxide (MAG-OX) 400 (240 Mg) MG tablet Take 400 mg by mouth daily.   metFORMIN  (GLUCOPHAGE ) 500 MG tablet Take 500 mg by mouth 2 (two) times daily with a meal.    primidone (MYSOLINE) 50 MG  tablet Take 50 mg by mouth at bedtime.   rosuvastatin (CRESTOR) 10 MG tablet Take 1 tablet two days weekly   spironolactone (ALDACTONE) 50 MG tablet Take 50 mg by mouth daily.   TRESIBA FLEXTOUCH 100 UNIT/ML FlexTouch Pen Inject 40 Units into the skin daily.   ascorbic acid (VITAMIN C) 1000 MG tablet Take 1,000 mg by mouth daily.   famotidine (PEPCID) 20 MG tablet Take 20 mg by mouth daily.   metoprolol  tartrate (LOPRESSOR ) 25 MG tablet Take 25 mg by mouth 2 (two) times daily.   No facility-administered encounter medications on file as of 05/19/2024.    History: Past Medical History:  Diagnosis Date   Anxiety    white coat syndrome (09/19/2012)   Arthritis    knees (09/19/2012)   Arthritis    back and legs (10/15/2013)   Chronic back pain    neck to lower back (09/19/2012)   Complication of anesthesia    trouble getting BM after back OR   History of blood transfusion 10/15/2013   Hypertension    Hypothyroidism    OSA on CPAP    PONV (postoperative nausea and vomiting)    Type II diabetes mellitus (HCC) 05/2012   Type II diabetes mellitus [facility])    Past Surgical History:  Procedure Laterality Date   ANTERIOR CERVICAL DECOMP/DISCECTOMY FUSION  2008   ANTERIOR CERVICAL DECOMP/DISCECTOMY FUSION  2009   APPENDECTOMY  1981   ARTERY REPAIR Left 10/15/2013   brachial   ARTERY REPAIR Left 10/15/2013   Procedure: PRIMARY BRACHIAL ARTERY REPAIR;  Surgeon: Gaile LELON New, MD;  Location: [facility] OR;  Service: Vascular;  Laterality: Left;  BACK SURGERY     BICEPS TENDON REPAIR Right 2010   CHOLECYSTECTOMY  2010   CHOLECYSTECTOMY  ~ 2007   COLONOSCOPY  2011   EXCISIONAL HEMORRHOIDECTOMY  2005   HERNIA REPAIR  2012   incisional; had to also go in around my navel (09/19/2012)   HERNIA REPAIR  ~ 2007   umbilical   I & D EXTREMITY Left 10/15/2013   upper arm w/removal of foreign body   I & D EXTREMITY Left 10/15/2013   Procedure: IRRIGATION AND DEBRIDEMENT EXTREMITY ;  Surgeon: Evalene JONETTA Chancy,  MD;  Location: [facility] OR;  Service: Orthopedics;  Laterality: Left;   KNEE ARTHROSCOPY Bilateral    KNEE ARTHROSCOPY Right X 2   KNEE ARTHROSCOPY Left X 1   NERVE AND ARTERY REPAIR Left 11/19/2013   Procedure: LEFT ARM IRRIGATION AND DEBRIDEMENT; SCAR REVISION; MEDIAN NERVE TENOLYSIS AND NEUROLYSIS; ARTERY EXPLORATION;  Surgeon: Elsie Mussel, MD;  Location: [facility] OR;  Service: Orthopedics;  Laterality: Left;   POSTERIOR LUMBAR FUSION  09/19/2012   POSTERIOR LUMBAR FUSION  ~ 2012   put rods in   VARICOSE VEIN SURGERY Left 1980's   WOUND EXPLORATION Left 10/15/2013   Procedure: WOUND EXPLORATION - LEFT ARM OPERATIVE SITE;  Surgeon: Evalene JONETTA Chancy, MD;  Location: [facility] OR;  Service: Orthopedics;  Laterality: Left;   Family History  Problem Relation Age of Onset   Heart disease Mother        before age 68   Heart disease Father        before age 2   Heart disease Sister        before age 41   Social History   Occupational History   Not on file  Tobacco Use   Smoking status: Never   Smokeless tobacco: Former   Tobacco comments:    quit chewing in the 1980's  Substance and Sexual Activity   Alcohol use: No   Drug use: No   Sexual activity: Yes   Tobacco Counseling Counseling given: Not Answered Tobacco comments: quit chewing in the 1980's  SDOH Screenings   Food Insecurity: No Food Insecurity (05/19/2024)  Housing: Low Risk (05/19/2024)  Transportation Needs: No Transportation Needs (05/19/2024)  Utilities: Not At Risk (05/19/2024)  Depression (PHQ2-9): Low Risk (05/19/2024)  Recent Concern: Depression (PHQ2-9) - Medium Risk (02/27/2024)  Financial Resource Strain: Patient Declined (05/19/2024)  Physical Activity: Sufficiently Active (05/19/2024)  Social Connections: Moderately Integrated (05/19/2024)  Stress: No Stress Concern Present (05/19/2024)  Tobacco Use: Medium Risk (05/19/2024)  Health Literacy: Adequate Health Literacy (05/19/2024)   See flowsheets for full screening  details  Depression Screen PHQ 2 & 9 Depression Scale- Over the past 2 weeks, how often have you been bothered by any of the following problems? Little interest or pleasure in doing things: 1 Feeling down, depressed, or hopeless (PHQ Adolescent also includes...irritable): 1 PHQ-2 Total Score: 2 Trouble falling or staying asleep, or sleeping too much: 0 Feeling tired or having little energy: 0 Poor appetite or overeating (PHQ Adolescent also includes...weight loss): 0 Feeling bad about yourself - or that you are a failure or have let yourself or your family down: 0 Trouble concentrating on things, such as reading the newspaper or watching television (PHQ Adolescent also includes...like school work): 0 Moving or speaking so slowly that other people could have noticed. Or the opposite - being so fidgety or restless that you have been moving around a lot more than usual: 0 Thoughts that you would be  better off dead, or of hurting yourself in some way: 0 PHQ-9 Total Score: 2 If you checked off any problems, how difficult have these problems made it for you to do your work, take care of things at home, or get along with other people?: Not difficult at all  Depression Treatment Depression Interventions/Treatment : [id number] Score <4 Follow-up Not Indicated     Goals Addressed               This Visit's Progress     Patient Stated (pt-stated)        Patient would like to lose some weight              Objective:    Today's Vitals   05/19/24 1105  BP: 120/80  Weight: 240 lb (108.9 kg)  Height: 5' 10 (1.778 m)   Body mass index is 34.44 kg/m.  Hearing/Vision screen Hearing Screening - Comments:: Patient has some difficulties  Vision Screening - Comments:: Patient wears glasses  Immunizations and Health Maintenance Health Maintenance  Topic Date Due   Medicare Annual Wellness (AWV)  Never done   FOOT EXAM  Never done   OPHTHALMOLOGY EXAM  Never done   Diabetic kidney  evaluation - Urine ACR  Never done   Hepatitis C Screening  Never done   Pneumococcal Vaccine: 50+ Years (1 of 2 - PCV) Never done   Colonoscopy  Never done   Zoster Vaccines- Shingrix (1 of 2) Never done   Influenza Vaccine  Never done   COVID-19 Vaccine (1 - 2025-26 season) Never done   HEMOGLOBIN A1C  08/27/2024   Diabetic kidney evaluation - eGFR measurement  02/26/2025   DTaP/Tdap/Td (3 - Td or Tdap) 03/29/2031   Meningococcal B Vaccine  Aged Out        Assessment/Plan:  This is a routine wellness examination for Joshua Nielsen.  Patient Care Team: Milon Cleaves, [place] as PCP - General (Physician Assistant) Camella Fallow, MD as Consulting Physician (Orthopedic Surgery)  I have personally reviewed and noted the following in the patients chart:   Medical and social history Use of alcohol, tobacco or illicit drugs  Current medications and supplements including opioid prescriptions. Functional ability and status Nutritional status Physical activity Advanced directives List of other physicians Hospitalizations, surgeries, and ER visits in previous 12 months Vitals Screenings to include cognitive, depression, and falls Referrals and appointments  No orders of the defined types were placed in this encounter.  In addition, I have reviewed and discussed with patient certain preventive protocols, quality metrics, and best practice recommendations. A written personalized care plan for preventive services as well as general preventive health recommendations were provided to patient.   Joshua Nielsen Right, [place]   05/19/2024   No follow-ups on file.  After Visit Summary: (MyChart) Due to this being a telephonic visit, the after visit summary with patients personalized plan was offered to patient via MyChart   Separate telephone note sent for (Medication change) Vaccines not given: all vaccinations declined today HM Addressed: patient was made aware of everything needed and he stated not at  this time.  "

## 2024-05-19 NOTE — Patient Instructions (Signed)
 Joshua Nielsen,  Thank you for taking the time for your Medicare Wellness Visit. I appreciate your continued commitment to your health goals. Please review the care plan we discussed, and feel free to reach out if I can assist you further.  Please note that Annual Wellness Visits do not include a physical exam. Some assessments may be limited, especially if the visit was conducted virtually. If needed, we may recommend an in-person follow-up with your provider.  Ongoing Care Seeing your primary care provider every 3 to 6 months helps us  monitor your health and provide consistent, personalized care.   Referrals If a referral was made during today's visit and you haven't received any updates within two weeks, please contact the referred provider directly to check on the status.  Recommended Screenings:  Health Maintenance  Topic Date Due   Medicare Annual Wellness Visit  Never done   Complete foot exam   Never done   Eye exam for diabetics  Never done   Yearly kidney health urinalysis for diabetes  Never done   Hepatitis C Screening  Never done   Pneumococcal Vaccine for age over 10 (1 of 2 - PCV) Never done   Colon Cancer Screening  Never done   Zoster (Shingles) Vaccine (1 of 2) Never done   DTaP/Tdap/Td vaccine (2 - Td or Tdap) 10/16/2023   Flu Shot  Never done   COVID-19 Vaccine (1 - 2025-26 season) Never done   Hemoglobin A1C  08/27/2024   Yearly kidney function blood test for diabetes  02/26/2025   Meningitis B Vaccine  Aged Out       05/19/2024   10:53 AM  Advanced Directives  Does Patient Have a Medical Advance Directive? Yes  Type of Advance Directive Living will  Does patient want to make changes to medical advance directive? No - Patient declined    Vision: Annual vision screenings are recommended for early detection of glaucoma, cataracts, and diabetic retinopathy. These exams can also reveal signs of chronic conditions such as diabetes and high blood pressure.  Dental:  Annual dental screenings help detect early signs of oral cancer, gum disease, and other conditions linked to overall health, including heart disease and diabetes.  Please see the attached documents for additional preventive care recommendations.

## 2024-06-01 ENCOUNTER — Ambulatory Visit: Payer: Medicare (Managed Care) | Admitting: Physician Assistant

## 2024-06-01 ENCOUNTER — Encounter: Payer: Self-pay | Admitting: Physician Assistant

## 2024-06-01 VITALS — BP 120/70 | HR 72 | Temp 97.5°F | Resp 16 | Ht 70.0 in | Wt 228.0 lb

## 2024-06-01 DIAGNOSIS — Z794 Long term (current) use of insulin: Secondary | ICD-10-CM | POA: Diagnosis not present

## 2024-06-01 DIAGNOSIS — E119 Type 2 diabetes mellitus without complications: Secondary | ICD-10-CM

## 2024-06-01 DIAGNOSIS — F331 Major depressive disorder, recurrent, moderate: Secondary | ICD-10-CM

## 2024-06-01 DIAGNOSIS — E039 Hypothyroidism, unspecified: Secondary | ICD-10-CM

## 2024-06-01 DIAGNOSIS — I10 Essential (primary) hypertension: Secondary | ICD-10-CM | POA: Diagnosis not present

## 2024-06-01 NOTE — Progress Notes (Signed)
 "  Subjective:  Patient ID: Joshua Nielsen, male    DOB: 1955-07-03  Age: 69 y.o. MRN: [id number]  Chief Complaint  Patient presents with   Medical Management of Chronic Issues    HPI: Discussed the use of AI scribe software for clinical note transcription with the patient, who gave verbal consent to proceed.  History of Present Illness Joshua Nielsen is a 69 year old male with type 2 diabetes who presents with concerns about blood sugar fluctuations and mental health following the recent passing of his wife.  He has experienced significant fluctuations in blood sugar levels, with recent readings ranging from 58 to over 250 mg/dL. After dinner, his blood sugar was 230 mg/dL and rose to over 749 mg/dL after consuming a bowl of soup. He administered insulin , but his blood sugar dropped to 58 mg/dL at 6:69 AM, prompting him to eat crackers to stabilize it. Blood sugar levels have been inconsistent, particularly during the last months of his wife's life when he ate irregularly. He is currently taking insulin  and manages his mealtime insulin  based on his blood sugar readings. He has difficulty in regulating his blood sugar.  He is experiencing symptoms of depression following the passing of his wife on November 14th. He describes a previous incident where he contemplated self-harm but was talked down by his son. Since his wife's passing, he has not had any further thoughts of self-harm. He is currently taking Wellbutrin and Cymbalta and feels they are helping. He sleeps a lot, with 2-3 hours in the afternoon and 6-8 hours at night, and feels rested upon waking. No current thoughts of self-harm, poor appetite, trouble concentrating, or significant difficulty in daily functioning. He feels tired on several days and occasionally feels bad about himself.  He recently lost his wife and has been dealing with the stress of managing household changes and responsibilities. He has a supportive family, including a  son who has been there for him during difficult times.          06/01/2024   10:55 AM 05/19/2024   11:15 AM 02/27/2024   11:24 AM  Depression screen PHQ 2/9  Decreased Interest 1 1 1   Down, Depressed, Hopeless 1 1 1   PHQ - 2 Score 2 2 2   Altered sleeping 2 0 2  Tired, decreased energy 1 0 2  Change in appetite 0 0 0  Feeling bad or failure about yourself  2 0 1  Trouble concentrating 0 0 0  Moving slowly or fidgety/restless 0 0 0  Suicidal thoughts 0 0 0  PHQ-9 Score 7 2 7    Difficult doing work/chores Not difficult at all Not difficult at all      Data saved with a previous flowsheet row definition        05/19/2024   10:53 AM  Fall Risk   Falls in the past year? 0   Number falls in past yr: 0  Injury with Fall? 0  Risk for fall due to : No Fall Risks  Follow up Falls evaluation completed;Falls prevention discussed     Manually entered by patient    Patient Care Team: Milon Cleaves, [place] as PCP - General (Physician Assistant) Camella Fallow, MD as Consulting Physician (Orthopedic Surgery)   Review of Systems  Constitutional:  Negative for chills, fatigue, fever and unexpected weight change.  HENT:  Negative for congestion, ear pain, sinus pain and sore throat.   Respiratory:  Negative for cough and shortness of breath.  Cardiovascular:  Negative for chest pain and palpitations.  Gastrointestinal:  Negative for abdominal pain, blood in stool, constipation, diarrhea, nausea and vomiting.  Endocrine: Negative for polydipsia.  Genitourinary:  Negative for dysuria.  Musculoskeletal:  Negative for back pain.  Skin:  Negative for rash.  Neurological:  Negative for headaches.    Medications Ordered Prior to Encounter[1] Past Medical History:  Diagnosis Date   Anxiety    white coat syndrome (09/19/2012)   Arthritis    knees (09/19/2012)   Arthritis    back and legs (10/15/2013)   Chronic back pain    neck to lower back (09/19/2012)   Complication of anesthesia     trouble getting BM after back OR   History of blood transfusion 10/15/2013   Hypertension    Hypothyroidism    OSA on CPAP    PONV (postoperative nausea and vomiting)    Type II diabetes mellitus (HCC) 05/2012   Type II diabetes mellitus [facility])    Past Surgical History:  Procedure Laterality Date   ANTERIOR CERVICAL DECOMP/DISCECTOMY FUSION  2008   ANTERIOR CERVICAL DECOMP/DISCECTOMY FUSION  2009   APPENDECTOMY  1981   ARTERY REPAIR Left 10/15/2013   brachial   ARTERY REPAIR Left 10/15/2013   Procedure: PRIMARY BRACHIAL ARTERY REPAIR;  Surgeon: Gaile LELON New, MD;  Location: [facility] OR;  Service: Vascular;  Laterality: Left;   BACK SURGERY     BICEPS TENDON REPAIR Right 2010   CHOLECYSTECTOMY  2010   CHOLECYSTECTOMY  ~ 2007   COLONOSCOPY  2011   EXCISIONAL HEMORRHOIDECTOMY  2005   HERNIA REPAIR  2012   incisional; had to also go in around my navel (09/19/2012)   HERNIA REPAIR  ~ 2007   umbilical   I & D EXTREMITY Left 10/15/2013   upper arm w/removal of foreign body   I & D EXTREMITY Left 10/15/2013   Procedure: IRRIGATION AND DEBRIDEMENT EXTREMITY ;  Surgeon: Evalene JONETTA Chancy, MD;  Location: [facility] OR;  Service: Orthopedics;  Laterality: Left;   KNEE ARTHROSCOPY Bilateral    KNEE ARTHROSCOPY Right X 2   KNEE ARTHROSCOPY Left X 1   NERVE AND ARTERY REPAIR Left 11/19/2013   Procedure: LEFT ARM IRRIGATION AND DEBRIDEMENT; SCAR REVISION; MEDIAN NERVE TENOLYSIS AND NEUROLYSIS; ARTERY EXPLORATION;  Surgeon: Elsie Mussel, MD;  Location: [facility] OR;  Service: Orthopedics;  Laterality: Left;   POSTERIOR LUMBAR FUSION  09/19/2012   POSTERIOR LUMBAR FUSION  ~ 2012   put rods in   VARICOSE VEIN SURGERY Left 1980's   WOUND EXPLORATION Left 10/15/2013   Procedure: WOUND EXPLORATION - LEFT ARM OPERATIVE SITE;  Surgeon: Evalene JONETTA Chancy, MD;  Location: [facility] OR;  Service: Orthopedics;  Laterality: Left;    Family History  Problem Relation Age of Onset   Heart disease Mother        before age 6   Heart  disease Father        before age 12   Heart disease Sister        before age 11   Social History   Socioeconomic History   Marital status: Married    Spouse name: Not on file   Number of children: Not on file   Years of education: Not on file   Highest education level: Associate degree: occupational, scientist, product/process development, or vocational program  Occupational History   Not on file  Tobacco Use   Smoking status: Never   Smokeless tobacco: Former    Types: Sports Administrator  Quit date: 1980   Tobacco comments:    quit chewing in the 1980's  Vaping Use   Vaping status: Never Used  Substance and Sexual Activity   Alcohol use: No   Drug use: No   Sexual activity: Not Currently  Other Topics Concern   Not on file  Social History Narrative   ** Merged History Encounter **       Social Drivers of Health   Tobacco Use: Medium Risk (06/01/2024)   Patient History    Smoking Tobacco Use: Never    Smokeless Tobacco Use: Former    Passive Exposure: Not on Actuary Strain: Patient Declined (05/19/2024)   Overall Financial Resource Strain (CARDIA)    Difficulty of Paying Living Expenses: Patient declined  Food Insecurity: No Food Insecurity (05/19/2024)   Epic    Worried About Programme Researcher, Broadcasting/film/video in the Last Year: Never true    Ran Out of Food in the Last Year: Never true  Transportation Needs: No Transportation Needs (05/19/2024)   Epic    Lack of Transportation (Medical): No    Lack of Transportation (Non-Medical): No  Physical Activity: Sufficiently Active (05/19/2024)   Exercise Vital Sign    Days of Exercise per Week: 5 days    Minutes of Exercise per Session: 40 min  Stress: No Stress Concern Present (05/19/2024)   [employer] of Occupational Health - Occupational Stress Questionnaire    Feeling of Stress: Not at all  Social Connections: Moderately Integrated (05/19/2024)   Social Connection and Isolation Panel    Frequency of Communication with Friends and Family: More than  three times a week    Frequency of Social Gatherings with Friends and Family: More than three times a week    Attends Religious Services: More than 4 times per year    Active Member of [employer] or Organizations: Yes    Attends Banker Meetings: More than 4 times per year    Marital Status: Widowed  Depression (PHQ2-9): Medium Risk (06/01/2024)   Depression (PHQ2-9)    PHQ-2 Score: 7  Alcohol Screen: Not on file  Housing: Low Risk (05/19/2024)   Epic    Unable to Pay for Housing in the Last Year: No    Number of Times Moved in the Last Year: 0    Homeless in the Last Year: No  Utilities: Not At Risk (05/19/2024)   Epic    Threatened with loss of utilities: No  Health Literacy: Adequate Health Literacy (05/19/2024)   B1300 Health Literacy    Frequency of need for help with medical instructions: Never    Objective:  BP 120/70   Pulse 72   Temp (!) 97.5 F (36.4 C)   Resp 16   Ht 5' 10 (1.778 m)   Wt 228 lb (103.4 kg)   SpO2 95%   BMI 32.71 kg/m      06/01/2024   10:02 AM 05/19/2024   11:05 AM 03/04/2024    9:29 AM  BP/Weight  Systolic BP 120 120 120  Diastolic BP 70 80 80  Wt. (Lbs) 228 240   BMI 32.71 kg/m2 34.44 kg/m2     Physical Exam Vitals reviewed.  Constitutional:      Appearance: Normal appearance.  Neck:     Vascular: No carotid bruit.  Cardiovascular:     Rate and Rhythm: Normal rate and regular rhythm.     Heart sounds: Normal heart sounds.  Pulmonary:     Effort:  Pulmonary effort is normal.     Breath sounds: Normal breath sounds.  Abdominal:     General: Bowel sounds are normal.     Palpations: Abdomen is soft.     Tenderness: There is no abdominal tenderness.  Neurological:     Mental Status: He is alert and oriented to person, place, and time.  Psychiatric:        Mood and Affect: Mood normal.        Behavior: Behavior normal.         Lab Results  Component Value Date   WBC 7.8 02/27/2024   HGB 15.1 02/27/2024   HCT 48.2  02/27/2024   PLT 276 02/27/2024   GLUCOSE 126 (H) 02/27/2024   CHOL 133 02/27/2024   TRIG 156 (H) 02/27/2024   HDL 44 02/27/2024   LDLCALC 62 02/27/2024   ALT 15 02/27/2024   AST 20 02/27/2024   NA 136 02/27/2024   K 4.3 02/27/2024   CL 95 (L) 02/27/2024   CREATININE 1.29 (H) 02/27/2024   BUN 14 02/27/2024   CO2 25 02/27/2024   TSH 2.030 02/27/2024   INR 1.08 10/15/2013   HGBA1C 14.3 (H) 02/27/2024    Results for orders placed or performed in visit on 02/27/24  CBC with Differential/Platelet   Collection Time: 02/27/24 11:45 AM  Result Value Ref Range   WBC 7.8 3.4 - 10.8 x10E3/uL   RBC 5.37 4.14 - 5.80 x10E6/uL   Hemoglobin 15.1 13.0 - 17.7 g/dL   Hematocrit 51.7 62.4 - 51.0 %   MCV 90 79 - 97 fL   MCH 28.1 26.6 - 33.0 pg   MCHC 31.3 (L) 31.5 - 35.7 g/dL   RDW 86.7 88.3 - 84.5 %   Platelets 276 150 - 450 x10E3/uL   Neutrophils 63 Not Estab. %   Lymphs 19 Not Estab. %   Monocytes 14 Not Estab. %   Eos 3 Not Estab. %   Basos 1 Not Estab. %   Neutrophils Absolute 5.0 1.4 - 7.0 x10E3/uL   Lymphocytes Absolute 1.5 0.7 - 3.1 x10E3/uL   Monocytes Absolute 1.1 (H) 0.1 - 0.9 x10E3/uL   EOS (ABSOLUTE) 0.2 0.0 - 0.4 x10E3/uL   Basophils Absolute 0.1 0.0 - 0.2 x10E3/uL   Immature Granulocytes 0 Not Estab. %   Immature Grans (Abs) 0.0 0.0 - 0.1 x10E3/uL  Comprehensive metabolic panel with GFR   Collection Time: 02/27/24 11:45 AM  Result Value Ref Range   Glucose 126 (H) 70 - 99 mg/dL   BUN 14 8 - 27 mg/dL   Creatinine, Ser 8.70 (H) 0.76 - 1.27 mg/dL   eGFR 60 >40 fO/fpw/8.26   BUN/Creatinine Ratio 11 10 - 24   Sodium 136 134 - 144 mmol/L   Potassium 4.3 3.5 - 5.2 mmol/L   Chloride 95 (L) 96 - 106 mmol/L   CO2 25 20 - 29 mmol/L   Calcium 9.5 8.6 - 10.2 mg/dL   Total Protein 7.3 6.0 - 8.5 g/dL   Albumin 4.1 3.9 - 4.9 g/dL   Globulin, Total 3.2 1.5 - 4.5 g/dL   Bilirubin Total 1.5 (H) 0.0 - 1.2 mg/dL   Alkaline Phosphatase 110 47 - 123 IU/L   AST 20 0 - 40 IU/L   ALT  15 0 - 44 IU/L  Hemoglobin A1c   Collection Time: 02/27/24 11:45 AM  Result Value Ref Range   Hgb A1c MFr Bld 14.3 (H) 4.8 - 5.6 %   Est. average glucose Bld  gHb Est-mCnc 364 mg/dL  Lipid panel   Collection Time: 02/27/24 11:45 AM  Result Value Ref Range   Cholesterol, Total 133 100 - 199 mg/dL   Triglycerides 843 (H) 0 - 149 mg/dL   HDL 44 >60 mg/dL   VLDL Cholesterol Cal 27 5 - 40 mg/dL   LDL Chol Calc (NIH) 62 0 - 99 mg/dL   Chol/HDL Ratio 3.0 0.0 - 5.0 ratio  T4, free   Collection Time: 02/27/24 11:45 AM  Result Value Ref Range   Free T4 1.24 0.82 - 1.77 ng/dL  TSH   Collection Time: 02/27/24 11:45 AM  Result Value Ref Range   TSH 2.030 0.450 - 4.500 uIU/mL  PSA   Collection Time: 02/27/24 11:45 AM  Result Value Ref Range   Prostate Specific Ag, Serum 1.1 0.0 - 4.0 ng/mL  B12 and Folate Panel   Collection Time: 02/27/24 11:45 AM  Result Value Ref Range   Vitamin B-12 1,619 (H) 232 - 1,245 pg/mL   Folate 13.4 >3.0 ng/mL  VITAMIN D  25 Hydroxy (Vit-D Deficiency, Fractures)   Collection Time: 02/27/24 11:45 AM  Result Value Ref Range   Vit D, 25-Hydroxy 32.9 30.0 - 100.0 ng/mL  .  Assessment & Plan:   Assessment & Plan Primary hypertension Controlled Continue to monitor at home Continue taking amlodipine  5mg  as prescribed Will adjust treatment based on readings Orders:   CBC with Differential/Platelet  Moderate episode of recurrent major depressive disorder (HCC) Recurrent major depressive disorder Recent bereavement. No suicidal ideation. Medications effective. Mild symptom increase noted. Discussed emotional challenges related to anniversaries and holidays. - Continue Wellbutrin 300mg  and Cymbalta 60mg . - Monitor for suicidal thoughts and contact provider if he occurs. - Utilize suicide hotline if needed. - Schedule follow-up if symptoms worsen.    Type 2 diabetes mellitus treated with insulin  (HCC) Type 2 diabetes mellitus Fluctuating glucose levels.  Recent hypoglycemia. Morning glucose 110-130 mg/dL. Awaiting A1c. Discussed insulin  pump and endocrinology referral if A1c high. - Ordered A1c test. - Ordered urine test for proteinuria. - Continue current insulin  regimen with adjustments based on A1c results. - Consider endocrinology referral if A1c remains high. - Discuss potential for insulin  pump if A1c remains high. - Continue taking Metformin  500mg , Tresiba 40units, and Humalog sliding scale Orders:   Comprehensive metabolic panel with GFR   Hemoglobin A1c   Lipid panel   Microalbumin / creatinine urine ratio  Acquired hypothyroidism Continue to monitor symptoms Denies any new or worsening symptoms Continue taking Sythroid 88mcg Labs drawn today Will adjust treatment based on results Orders:   T4, free   TSH    Body mass index is 32.71 kg/m.     No orders of the defined types were placed in this encounter.   Orders Placed This Encounter  Procedures   CBC with Differential/Platelet   Comprehensive metabolic panel with GFR   Hemoglobin A1c   Lipid panel   T4, free   TSH   Microalbumin / creatinine urine ratio       Follow-up: No follow-ups on file.  An After Visit Summary was printed and given to the patient.  Nola Angles, [place] [facility] [phone]     [1]  Current Outpatient Medications on File Prior to Visit  Medication Sig Dispense Refill   amLODipine  (NORVASC ) 5 MG tablet Take 5 mg by mouth daily.     buPROPion (WELLBUTRIN XL) 300 MG 24 hr tablet Take 300 mg by mouth daily.  Continuous Glucose Sensor (FREESTYLE LIBRE 3 PLUS SENSOR) MISC Change sensor every 15 days. 6 each 3   cyanocobalamin (VITAMIN B12) 1000 MCG tablet Take 1,500 mcg by mouth daily. (Patient taking differently: Take 1,500 mcg by mouth every other day.)     DULoxetine (CYMBALTA) 60 MG capsule Take 60 mg by mouth daily.     EPINEPHrine  (EPIPEN ) 0.3 mg/0.3 mL IJ SOAJ injection Inject 0.3 mg into the muscle once.  (Patient taking differently: Inject 0.3 mg into the muscle as needed for anaphylaxis.)     furosemide (LASIX) 20 MG tablet Take 20 mg by mouth daily.     insulin  lispro (HUMALOG) 100 UNIT/ML KwikPen Sliding scale     Insulin  Pen Needle 31G X 8 MM MISC 3 (three) times daily. as directed     levothyroxine  (SYNTHROID ) 88 MCG tablet Take 88 mcg by mouth every morning.     losartan (COZAAR) 100 MG tablet Take 100 mg by mouth daily.     magnesium oxide (MAG-OX) 400 (240 Mg) MG tablet Take 400 mg by mouth daily.     metFORMIN  (GLUCOPHAGE ) 500 MG tablet Take 500 mg by mouth 2 (two) times daily with a meal.      primidone (MYSOLINE) 50 MG tablet Take 50 mg by mouth at bedtime.     rosuvastatin (CRESTOR) 10 MG tablet Take 1 tablet two days weekly     spironolactone (ALDACTONE) 50 MG tablet Take 50 mg by mouth daily.     TRESIBA FLEXTOUCH 100 UNIT/ML FlexTouch Pen Inject 40 Units into the skin daily. (Patient taking differently: Inject 40 Units into the skin 2 (two) times daily.)     famotidine (PEPCID) 20 MG tablet Take 20 mg by mouth daily.     No current facility-administered medications on file prior to visit.   "

## 2024-06-01 NOTE — Assessment & Plan Note (Signed)
 Type 2 diabetes mellitus Fluctuating glucose levels. Recent hypoglycemia. Morning glucose 110-130 mg/dL. Awaiting A1c. Discussed insulin  pump and endocrinology referral if A1c high. - Ordered A1c test. - Ordered urine test for proteinuria. - Continue current insulin  regimen with adjustments based on A1c results. - Consider endocrinology referral if A1c remains high. - Discuss potential for insulin  pump if A1c remains high. - Continue taking Metformin  500mg , Tresiba 40units, and Humalog sliding scale Orders:   Comprehensive metabolic panel with GFR   Hemoglobin A1c   Lipid panel   Microalbumin / creatinine urine ratio

## 2024-06-01 NOTE — Assessment & Plan Note (Signed)
 Continue to monitor symptoms Denies any new or worsening symptoms Continue taking Sythroid 88mcg Labs drawn today Will adjust treatment based on results Orders:   T4, free   TSH

## 2024-06-01 NOTE — Assessment & Plan Note (Signed)
 Controlled Continue to monitor at home Continue taking amlodipine  5mg  as prescribed Will adjust treatment based on readings Orders:   CBC with Differential/Platelet

## 2024-06-01 NOTE — Assessment & Plan Note (Signed)
 Recurrent major depressive disorder Recent bereavement. No suicidal ideation. Medications effective. Mild symptom increase noted. Discussed emotional challenges related to anniversaries and holidays. - Continue Wellbutrin 300mg  and Cymbalta 60mg . - Monitor for suicidal thoughts and contact provider if he occurs. - Utilize suicide hotline if needed. - Schedule follow-up if symptoms worsen.

## 2024-06-02 ENCOUNTER — Ambulatory Visit: Payer: Self-pay | Admitting: Physician Assistant

## 2024-06-02 LAB — COMPREHENSIVE METABOLIC PANEL WITH GFR
ALT: 15 IU/L (ref 0–44)
AST: 19 IU/L (ref 0–40)
Albumin: 4.3 g/dL (ref 3.9–4.9)
Alkaline Phosphatase: 95 IU/L (ref 47–123)
BUN/Creatinine Ratio: 10 (ref 10–24)
BUN: 12 mg/dL (ref 8–27)
Bilirubin Total: 1.5 mg/dL — ABNORMAL HIGH (ref 0.0–1.2)
CO2: 25 mmol/L (ref 20–29)
Calcium: 9.4 mg/dL (ref 8.6–10.2)
Chloride: 96 mmol/L (ref 96–106)
Creatinine, Ser: 1.15 mg/dL (ref 0.76–1.27)
Globulin, Total: 3.4 g/dL (ref 1.5–4.5)
Glucose: 101 mg/dL — ABNORMAL HIGH (ref 70–99)
Potassium: 4.3 mmol/L (ref 3.5–5.2)
Sodium: 137 mmol/L (ref 134–144)
Total Protein: 7.7 g/dL (ref 6.0–8.5)
eGFR: 69 mL/min/1.73

## 2024-06-02 LAB — CBC WITH DIFFERENTIAL/PLATELET
Basophils Absolute: 0.1 x10E3/uL (ref 0.0–0.2)
Basos: 1 %
EOS (ABSOLUTE): 0.2 x10E3/uL (ref 0.0–0.4)
Eos: 2 %
Hematocrit: 46.4 % (ref 37.5–51.0)
Hemoglobin: 15.2 g/dL (ref 13.0–17.7)
Immature Grans (Abs): 0.1 x10E3/uL (ref 0.0–0.1)
Immature Granulocytes: 1 %
Lymphocytes Absolute: 1.7 x10E3/uL (ref 0.7–3.1)
Lymphs: 21 %
MCH: 29.4 pg (ref 26.6–33.0)
MCHC: 32.8 g/dL (ref 31.5–35.7)
MCV: 90 fL (ref 79–97)
Monocytes Absolute: 0.9 x10E3/uL (ref 0.1–0.9)
Monocytes: 11 %
Neutrophils Absolute: 5.1 x10E3/uL (ref 1.4–7.0)
Neutrophils: 64 %
Platelets: 272 x10E3/uL (ref 150–450)
RBC: 5.17 x10E6/uL (ref 4.14–5.80)
RDW: 14.2 % (ref 11.6–15.4)
WBC: 8 x10E3/uL (ref 3.4–10.8)

## 2024-06-02 LAB — HEMOGLOBIN A1C
Est. average glucose Bld gHb Est-mCnc: 194 mg/dL
Hgb A1c MFr Bld: 8.4 % — ABNORMAL HIGH (ref 4.8–5.6)

## 2024-06-02 LAB — TSH: TSH: 2.05 u[IU]/mL (ref 0.450–4.500)

## 2024-06-02 LAB — MICROALBUMIN / CREATININE URINE RATIO
Creatinine, Urine: 48 mg/dL
Microalb/Creat Ratio: <6 mg/g{creat} (ref 0–29)
Microalbumin, Urine: <3 ug/mL

## 2024-06-02 LAB — LIPID PANEL
Chol/HDL Ratio: 2.9 ratio (ref 0.0–5.0)
Cholesterol, Total: 138 mg/dL (ref 100–199)
HDL: 47 mg/dL
LDL Chol Calc (NIH): 64 mg/dL (ref 0–99)
Triglycerides: 158 mg/dL — ABNORMAL HIGH (ref 0–149)
VLDL Cholesterol Cal: 27 mg/dL (ref 5–40)

## 2024-06-02 LAB — T4, FREE: Free T4: 1.41 ng/dL (ref 0.82–1.77)

## 2024-08-31 ENCOUNTER — Ambulatory Visit: Payer: Medicare (Managed Care) | Admitting: Physician Assistant
